# Patient Record
Sex: Female | Born: 1980 | Race: Black or African American | Hispanic: No | Marital: Single | Smoking: Current every day smoker
Health system: Southern US, Community
[De-identification: ages and names within clinical notes are randomized; demographics above are authoritative.]

---

## 2002-01-20 ENCOUNTER — Other Ambulatory Visit: Admission: RE | Admit: 2002-01-20 | Discharge: 2002-01-20 | Payer: Self-pay | Admitting: Family Medicine

## 2002-03-09 ENCOUNTER — Other Ambulatory Visit: Admission: RE | Admit: 2002-03-09 | Discharge: 2002-03-09 | Payer: Self-pay | Admitting: Obstetrics and Gynecology

## 2002-03-09 ENCOUNTER — Encounter (INDEPENDENT_AMBULATORY_CARE_PROVIDER_SITE_OTHER): Payer: Self-pay | Admitting: *Deleted

## 2002-03-09 ENCOUNTER — Encounter: Admission: RE | Admit: 2002-03-09 | Discharge: 2002-03-09 | Payer: Self-pay | Admitting: *Deleted

## 2002-06-10 ENCOUNTER — Encounter (INDEPENDENT_AMBULATORY_CARE_PROVIDER_SITE_OTHER): Payer: Self-pay | Admitting: Specialist

## 2002-06-10 ENCOUNTER — Encounter: Admission: RE | Admit: 2002-06-10 | Discharge: 2002-06-10 | Payer: Self-pay | Admitting: Obstetrics and Gynecology

## 2002-08-23 ENCOUNTER — Emergency Department (HOSPITAL_COMMUNITY): Admission: EM | Admit: 2002-08-23 | Discharge: 2002-08-23 | Payer: Self-pay | Admitting: Emergency Medicine

## 2003-05-12 ENCOUNTER — Other Ambulatory Visit: Admission: RE | Admit: 2003-05-12 | Discharge: 2003-05-12 | Payer: Self-pay | Admitting: Family Medicine

## 2003-06-10 ENCOUNTER — Emergency Department (HOSPITAL_COMMUNITY): Admission: AD | Admit: 2003-06-10 | Discharge: 2003-06-10 | Payer: Self-pay | Admitting: Family Medicine

## 2003-08-23 ENCOUNTER — Encounter: Admission: RE | Admit: 2003-08-23 | Discharge: 2003-08-23 | Payer: Self-pay | Admitting: Sports Medicine

## 2003-09-21 ENCOUNTER — Encounter: Admission: RE | Admit: 2003-09-21 | Discharge: 2003-09-21 | Payer: Self-pay | Admitting: Family Medicine

## 2003-10-21 ENCOUNTER — Encounter: Admission: RE | Admit: 2003-10-21 | Discharge: 2003-10-21 | Payer: Self-pay | Admitting: Family Medicine

## 2003-11-30 ENCOUNTER — Encounter (INDEPENDENT_AMBULATORY_CARE_PROVIDER_SITE_OTHER): Payer: Self-pay | Admitting: *Deleted

## 2003-11-30 LAB — CONVERTED CEMR LAB

## 2003-12-26 ENCOUNTER — Encounter (INDEPENDENT_AMBULATORY_CARE_PROVIDER_SITE_OTHER): Payer: Self-pay | Admitting: Specialist

## 2003-12-26 ENCOUNTER — Other Ambulatory Visit: Admission: RE | Admit: 2003-12-26 | Discharge: 2003-12-26 | Payer: Self-pay | Admitting: Family Medicine

## 2003-12-26 ENCOUNTER — Encounter: Admission: RE | Admit: 2003-12-26 | Discharge: 2003-12-26 | Payer: Self-pay | Admitting: Family Medicine

## 2004-01-18 ENCOUNTER — Encounter: Admission: RE | Admit: 2004-01-18 | Discharge: 2004-01-18 | Payer: Self-pay | Admitting: Family Medicine

## 2004-01-31 ENCOUNTER — Encounter: Admission: RE | Admit: 2004-01-31 | Discharge: 2004-01-31 | Payer: Self-pay | Admitting: Family Medicine

## 2004-02-21 ENCOUNTER — Encounter: Admission: RE | Admit: 2004-02-21 | Discharge: 2004-02-21 | Payer: Self-pay | Admitting: Family Medicine

## 2004-04-05 ENCOUNTER — Ambulatory Visit: Payer: Self-pay | Admitting: Family Medicine

## 2004-07-12 ENCOUNTER — Encounter (INDEPENDENT_AMBULATORY_CARE_PROVIDER_SITE_OTHER): Payer: Self-pay | Admitting: *Deleted

## 2004-07-12 ENCOUNTER — Ambulatory Visit: Payer: Self-pay | Admitting: Family Medicine

## 2004-12-18 ENCOUNTER — Ambulatory Visit: Payer: Self-pay | Admitting: Obstetrics and Gynecology

## 2004-12-18 ENCOUNTER — Encounter (INDEPENDENT_AMBULATORY_CARE_PROVIDER_SITE_OTHER): Payer: Self-pay | Admitting: *Deleted

## 2006-06-06 ENCOUNTER — Ambulatory Visit: Payer: Self-pay | Admitting: Family Medicine

## 2006-06-06 ENCOUNTER — Encounter (INDEPENDENT_AMBULATORY_CARE_PROVIDER_SITE_OTHER): Payer: Self-pay | Admitting: *Deleted

## 2006-06-06 ENCOUNTER — Other Ambulatory Visit: Admission: RE | Admit: 2006-06-06 | Discharge: 2006-06-06 | Payer: Self-pay | Admitting: Family Medicine

## 2006-06-16 ENCOUNTER — Ambulatory Visit: Payer: Self-pay | Admitting: Family Medicine

## 2006-08-28 DIAGNOSIS — J45909 Unspecified asthma, uncomplicated: Secondary | ICD-10-CM | POA: Insufficient documentation

## 2006-08-28 DIAGNOSIS — E669 Obesity, unspecified: Secondary | ICD-10-CM | POA: Insufficient documentation

## 2006-08-28 DIAGNOSIS — R8789 Other abnormal findings in specimens from female genital organs: Secondary | ICD-10-CM

## 2006-08-28 DIAGNOSIS — N879 Dysplasia of cervix uteri, unspecified: Secondary | ICD-10-CM | POA: Insufficient documentation

## 2006-08-28 DIAGNOSIS — F17200 Nicotine dependence, unspecified, uncomplicated: Secondary | ICD-10-CM

## 2006-08-29 ENCOUNTER — Encounter (INDEPENDENT_AMBULATORY_CARE_PROVIDER_SITE_OTHER): Payer: Self-pay | Admitting: *Deleted

## 2006-10-30 ENCOUNTER — Ambulatory Visit: Payer: Self-pay | Admitting: Obstetrics & Gynecology

## 2006-10-30 ENCOUNTER — Encounter (INDEPENDENT_AMBULATORY_CARE_PROVIDER_SITE_OTHER): Payer: Self-pay | Admitting: Obstetrics & Gynecology

## 2007-03-25 ENCOUNTER — Ambulatory Visit: Payer: Self-pay | Admitting: Obstetrics and Gynecology

## 2007-03-27 ENCOUNTER — Ambulatory Visit (HOSPITAL_COMMUNITY): Admission: RE | Admit: 2007-03-27 | Discharge: 2007-03-27 | Payer: Self-pay | Admitting: Obstetrics and Gynecology

## 2007-04-01 ENCOUNTER — Ambulatory Visit: Payer: Self-pay | Admitting: Obstetrics & Gynecology

## 2007-04-16 ENCOUNTER — Ambulatory Visit: Payer: Self-pay | Admitting: Obstetrics and Gynecology

## 2007-12-22 ENCOUNTER — Telehealth (INDEPENDENT_AMBULATORY_CARE_PROVIDER_SITE_OTHER): Payer: Self-pay | Admitting: *Deleted

## 2007-12-22 ENCOUNTER — Ambulatory Visit: Payer: Self-pay | Admitting: Family Medicine

## 2007-12-22 DIAGNOSIS — N63 Unspecified lump in unspecified breast: Secondary | ICD-10-CM

## 2007-12-22 DIAGNOSIS — N6019 Diffuse cystic mastopathy of unspecified breast: Secondary | ICD-10-CM

## 2007-12-25 ENCOUNTER — Telehealth: Payer: Self-pay | Admitting: Family Medicine

## 2007-12-28 ENCOUNTER — Encounter: Payer: Self-pay | Admitting: Family Medicine

## 2007-12-28 ENCOUNTER — Telehealth: Payer: Self-pay | Admitting: *Deleted

## 2007-12-28 ENCOUNTER — Telehealth: Payer: Self-pay | Admitting: Family Medicine

## 2007-12-29 IMAGING — US US TRANSVAGINAL NON-OB
1 series · 14 of 25 positions shown · non-contrast
Comparison: none

CLINICAL DATA: Irregular menstrual cycles.
 TRANSABDOMINAL AND TRANSVAGINAL PELVIC ULTRASOUND:
TECHNIQUE: Both transabdominal and transvaginal ultrasound examinations of the pelvis were performed including evaluation of the uterus, ovaries, adnexal regions, and pelvic cul-de-sac.

[Series 1: us transvaginal non-ob · 0.28mm/px · 14 of 47 slices shown]
[im 1/47]
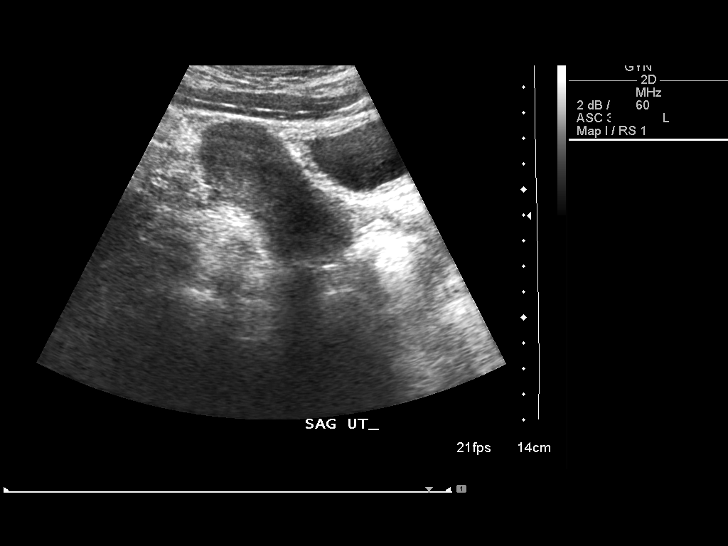
[im 4/47]
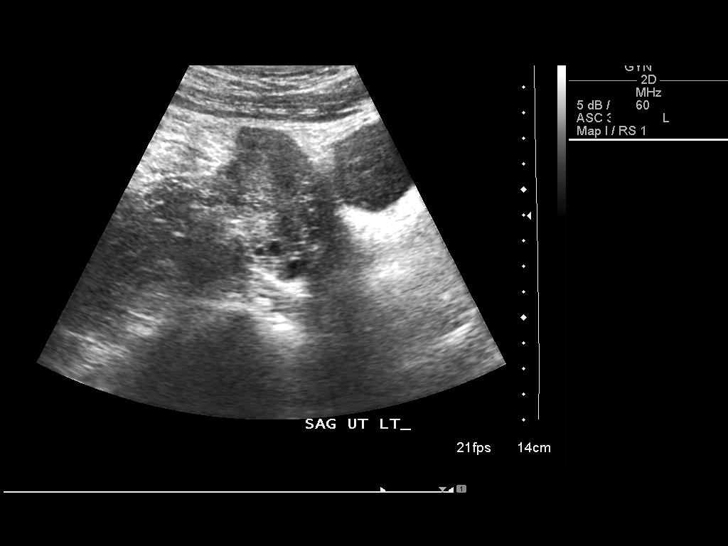
[im 8/47]
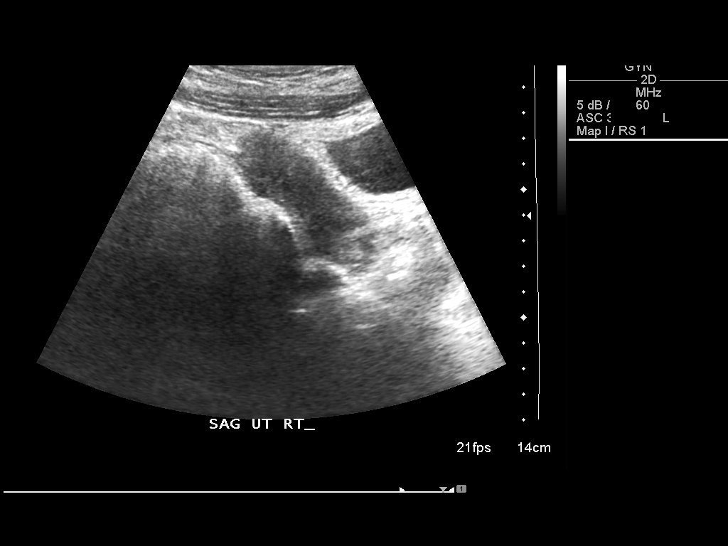
[im 12/47]
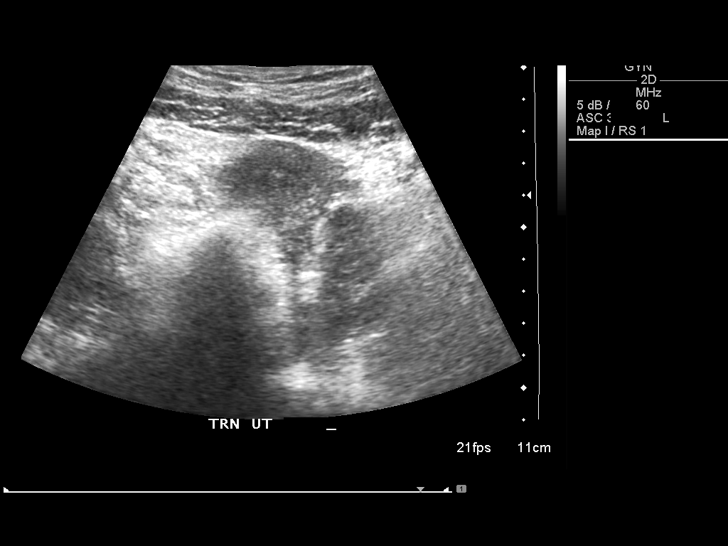
[im 16/47]
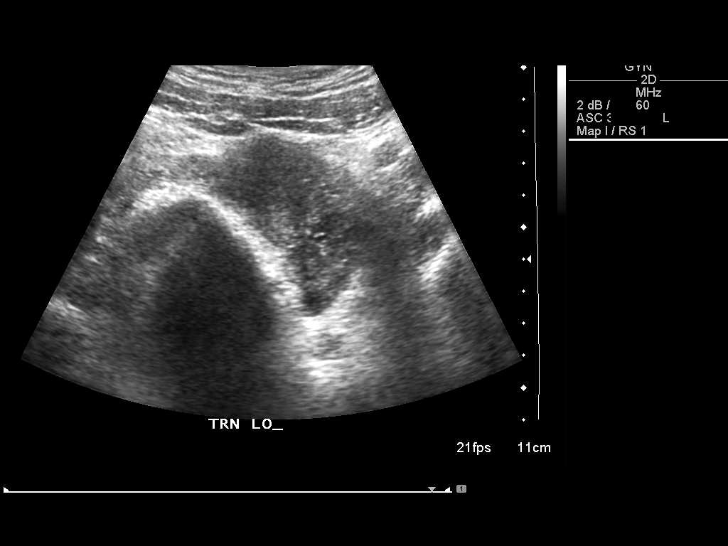
[im 18/47]
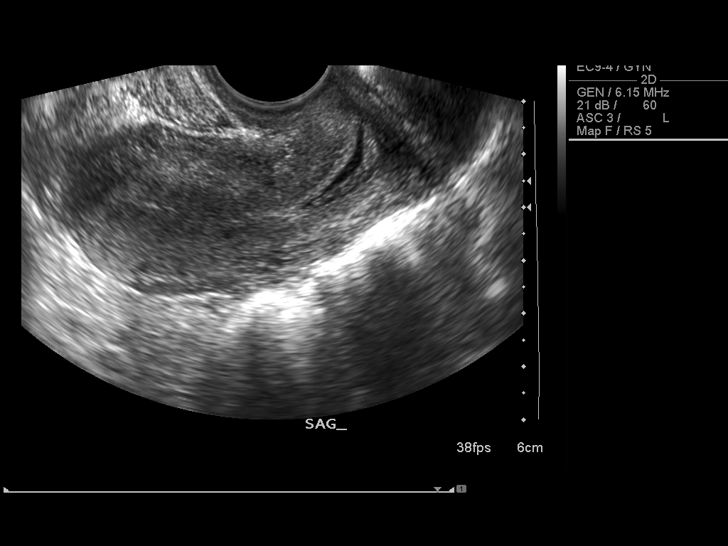
[im 22/47]
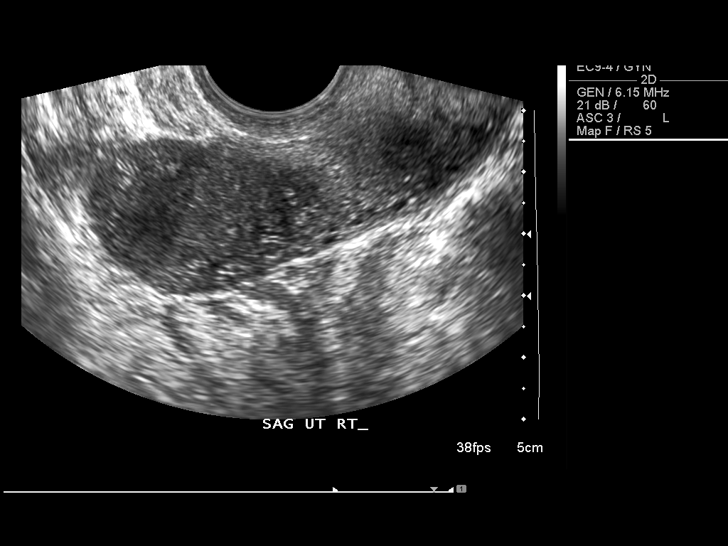
[im 25/47]
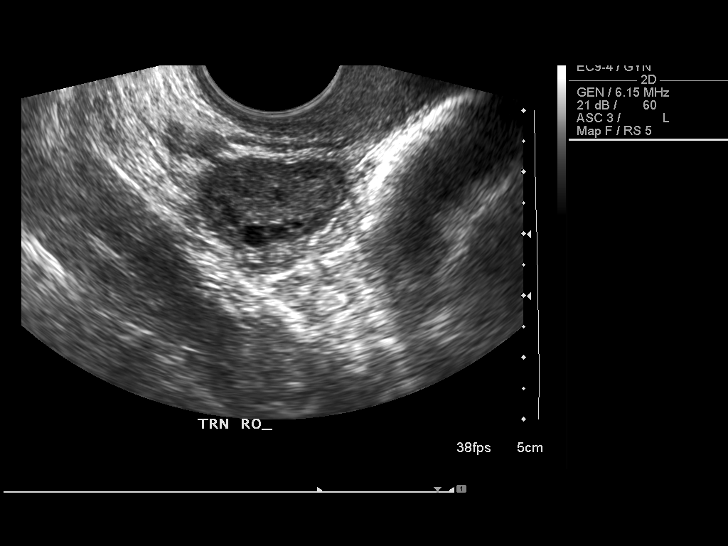
[im 29/47]
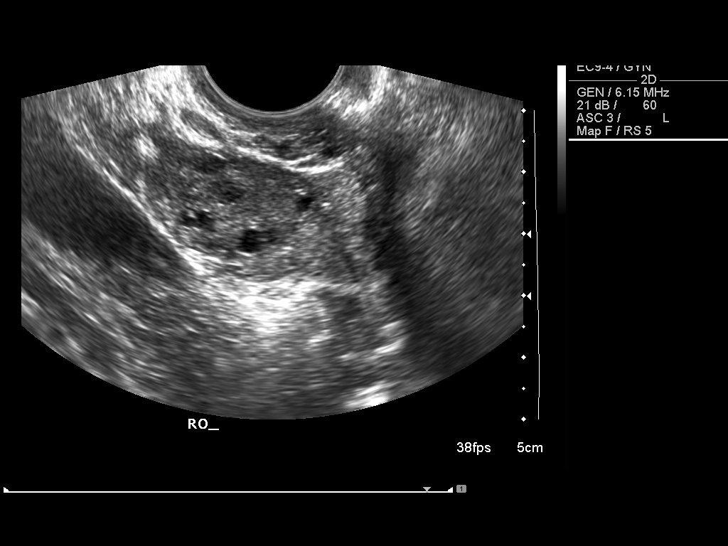
[im 31/47]
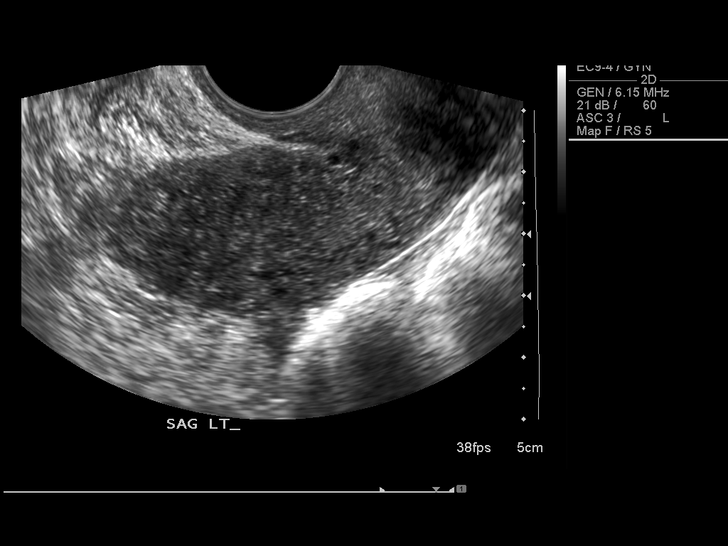
[im 35/47]
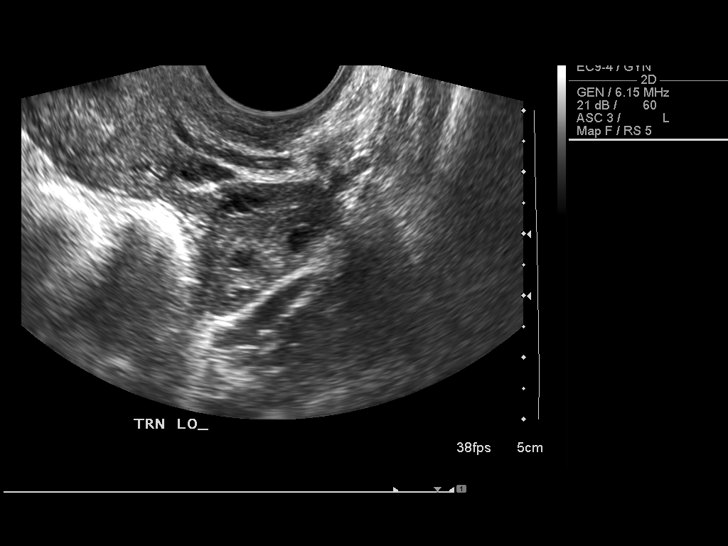
[im 39/47]
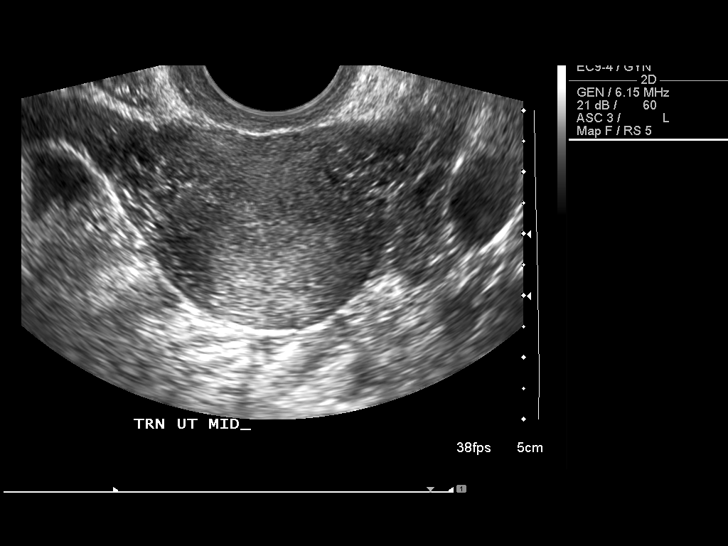
[im 43/47]
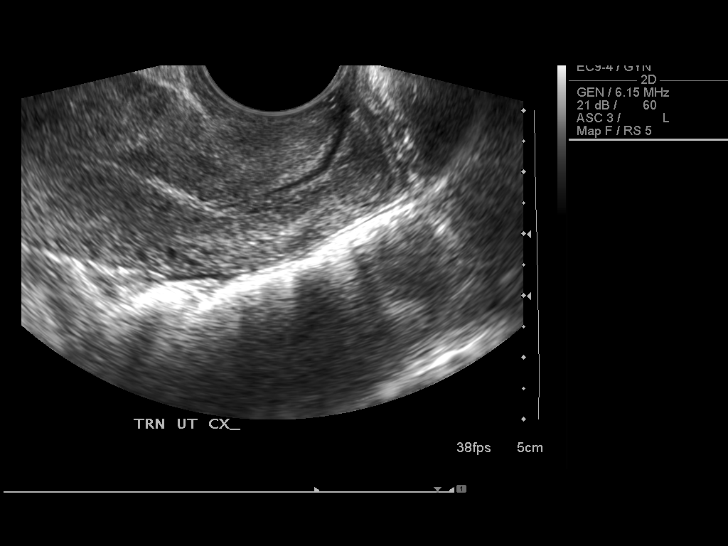
[im 47/47]
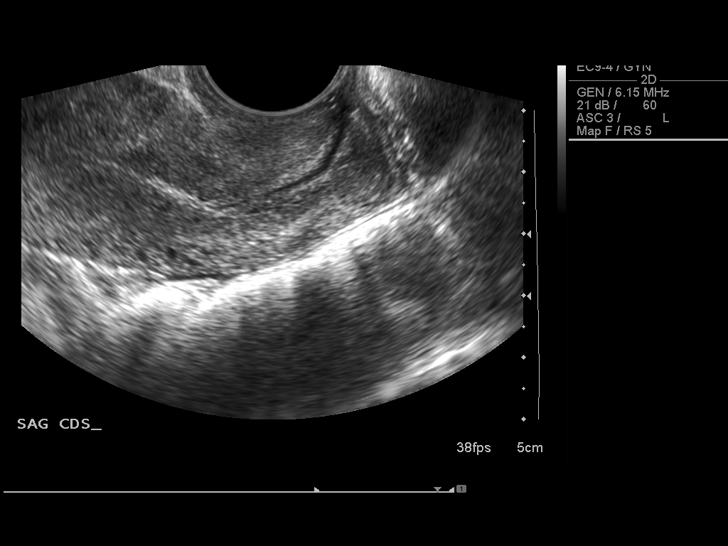

[14 of 25 positions shown; findings below may reference images not displayed]

FINDINGS: The uterus has a normal size, shape, and echotexture.  The uterine dimensions are 7.3 x 3.3 x 4.3 cm.  The endometrium is thin and homogeneous, measuring 4 mm in width.  Both ovaries have a normal size and appearance.  The right ovary measures 3.1 x 1.5 x 2.2 cm, and the left ovary measures 3.5 x 1.9 x 2.0 cm.  No free pelvic fluid is seen.
IMPRESSION: Normal pelvic ultrasound.

## 2008-01-13 ENCOUNTER — Encounter: Payer: Self-pay | Admitting: Family Medicine

## 2008-01-13 ENCOUNTER — Other Ambulatory Visit: Admission: RE | Admit: 2008-01-13 | Discharge: 2008-01-13 | Payer: Self-pay | Admitting: Family Medicine

## 2008-01-13 ENCOUNTER — Ambulatory Visit: Payer: Self-pay | Admitting: Family Medicine

## 2008-01-13 DIAGNOSIS — E663 Overweight: Secondary | ICD-10-CM | POA: Insufficient documentation

## 2008-03-16 ENCOUNTER — Ambulatory Visit: Payer: Self-pay | Admitting: Family Medicine

## 2008-04-07 ENCOUNTER — Ambulatory Visit: Payer: Self-pay | Admitting: Family Medicine

## 2008-04-08 ENCOUNTER — Telehealth: Payer: Self-pay | Admitting: Family Medicine

## 2008-06-29 ENCOUNTER — Ambulatory Visit: Payer: Self-pay | Admitting: Family Medicine

## 2008-10-04 ENCOUNTER — Telehealth: Payer: Self-pay | Admitting: Internal Medicine

## 2008-12-10 ENCOUNTER — Emergency Department (HOSPITAL_COMMUNITY): Admission: EM | Admit: 2008-12-10 | Discharge: 2008-12-10 | Payer: Self-pay | Admitting: Emergency Medicine

## 2009-02-02 ENCOUNTER — Telehealth: Payer: Self-pay | Admitting: Family Medicine

## 2009-02-03 ENCOUNTER — Encounter: Payer: Self-pay | Admitting: Family Medicine

## 2009-02-03 ENCOUNTER — Other Ambulatory Visit: Admission: RE | Admit: 2009-02-03 | Discharge: 2009-02-03 | Payer: Self-pay | Admitting: Family Medicine

## 2009-02-03 ENCOUNTER — Ambulatory Visit: Payer: Self-pay | Admitting: Family Medicine

## 2009-02-03 DIAGNOSIS — N912 Amenorrhea, unspecified: Secondary | ICD-10-CM

## 2009-09-13 IMAGING — CR DG CHEST 2V
2 series · 2 of 2 positions shown · non-contrast
Comparison: None

CLINICAL DATA: Short of breath

CHEST - 2 VIEW

[w chest pa]
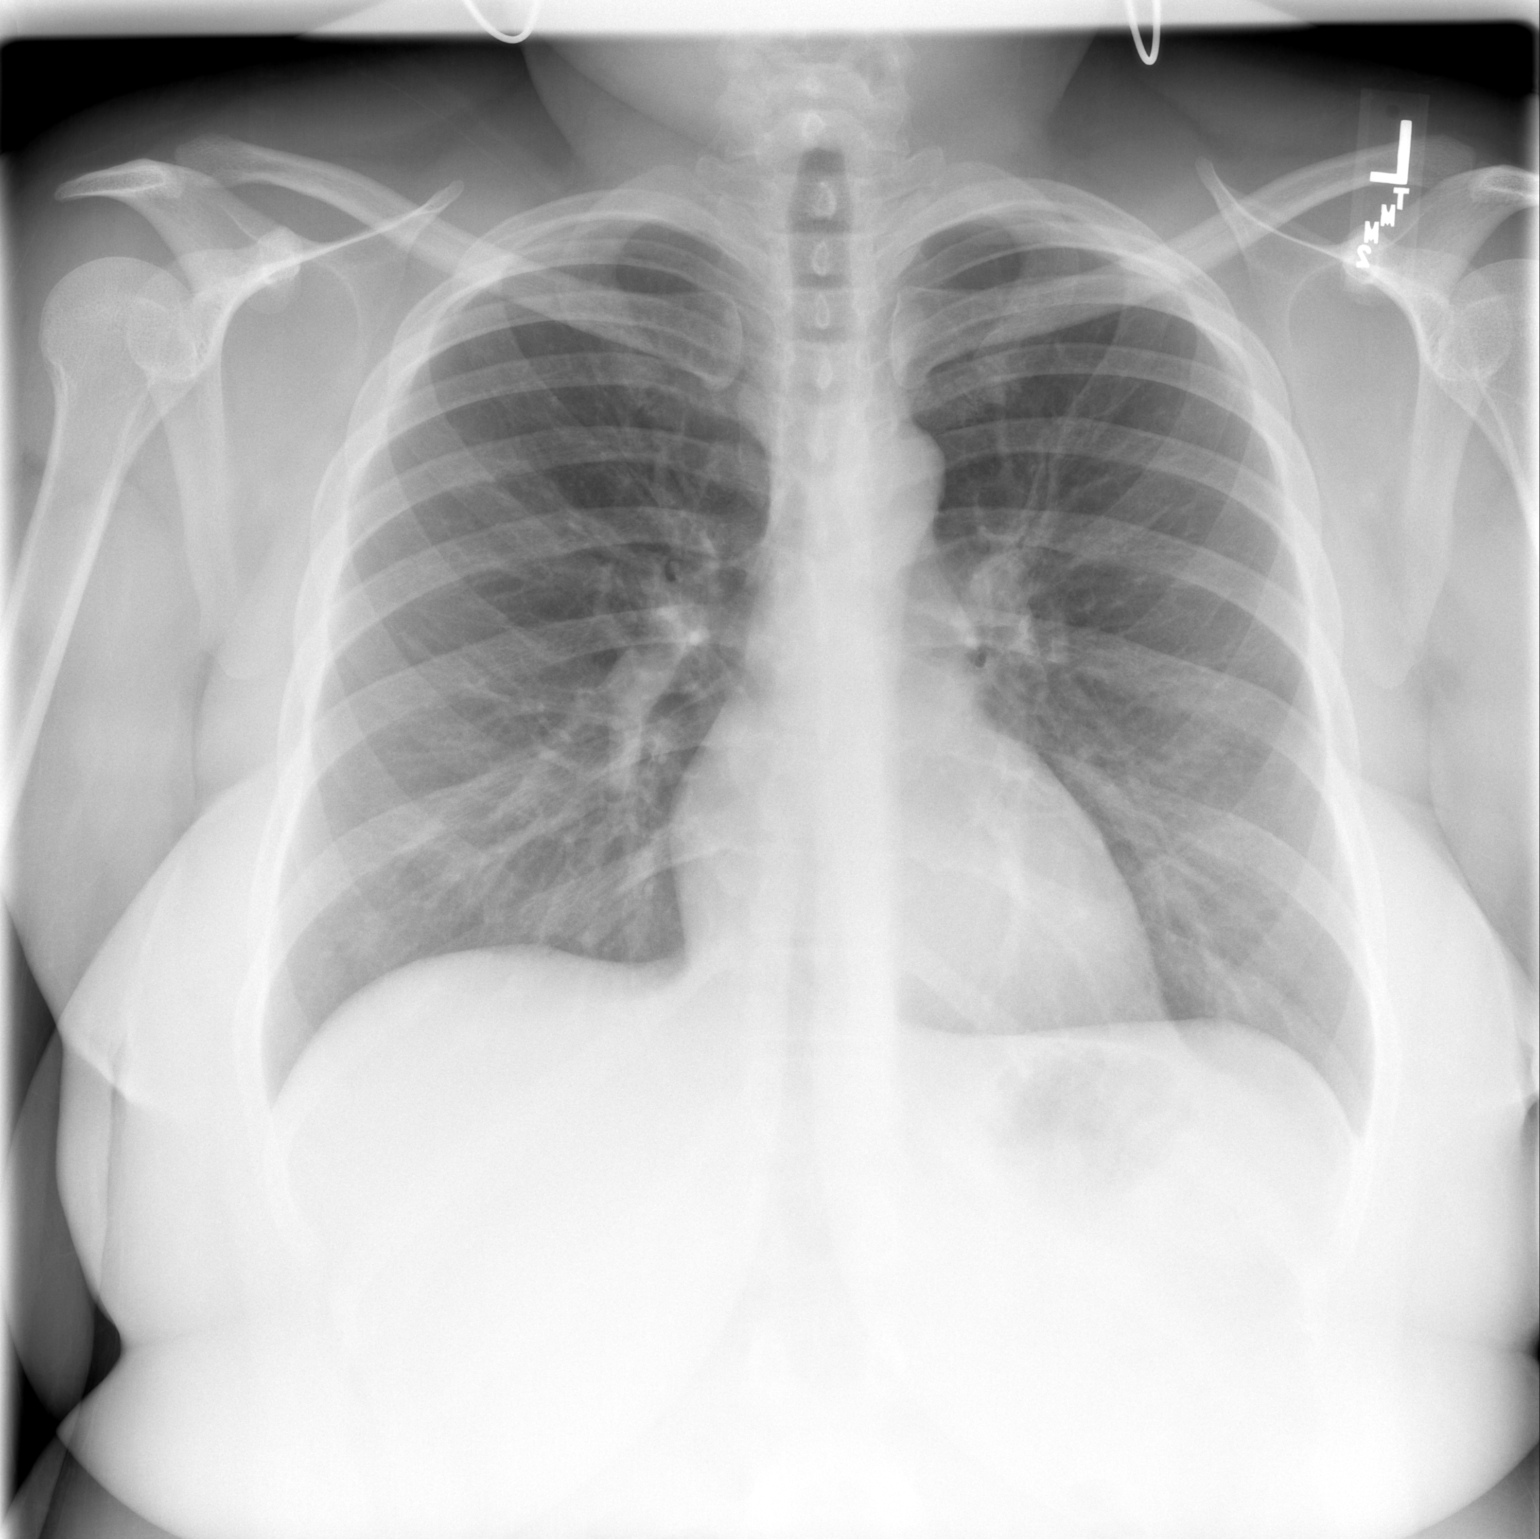

[w chest lat]
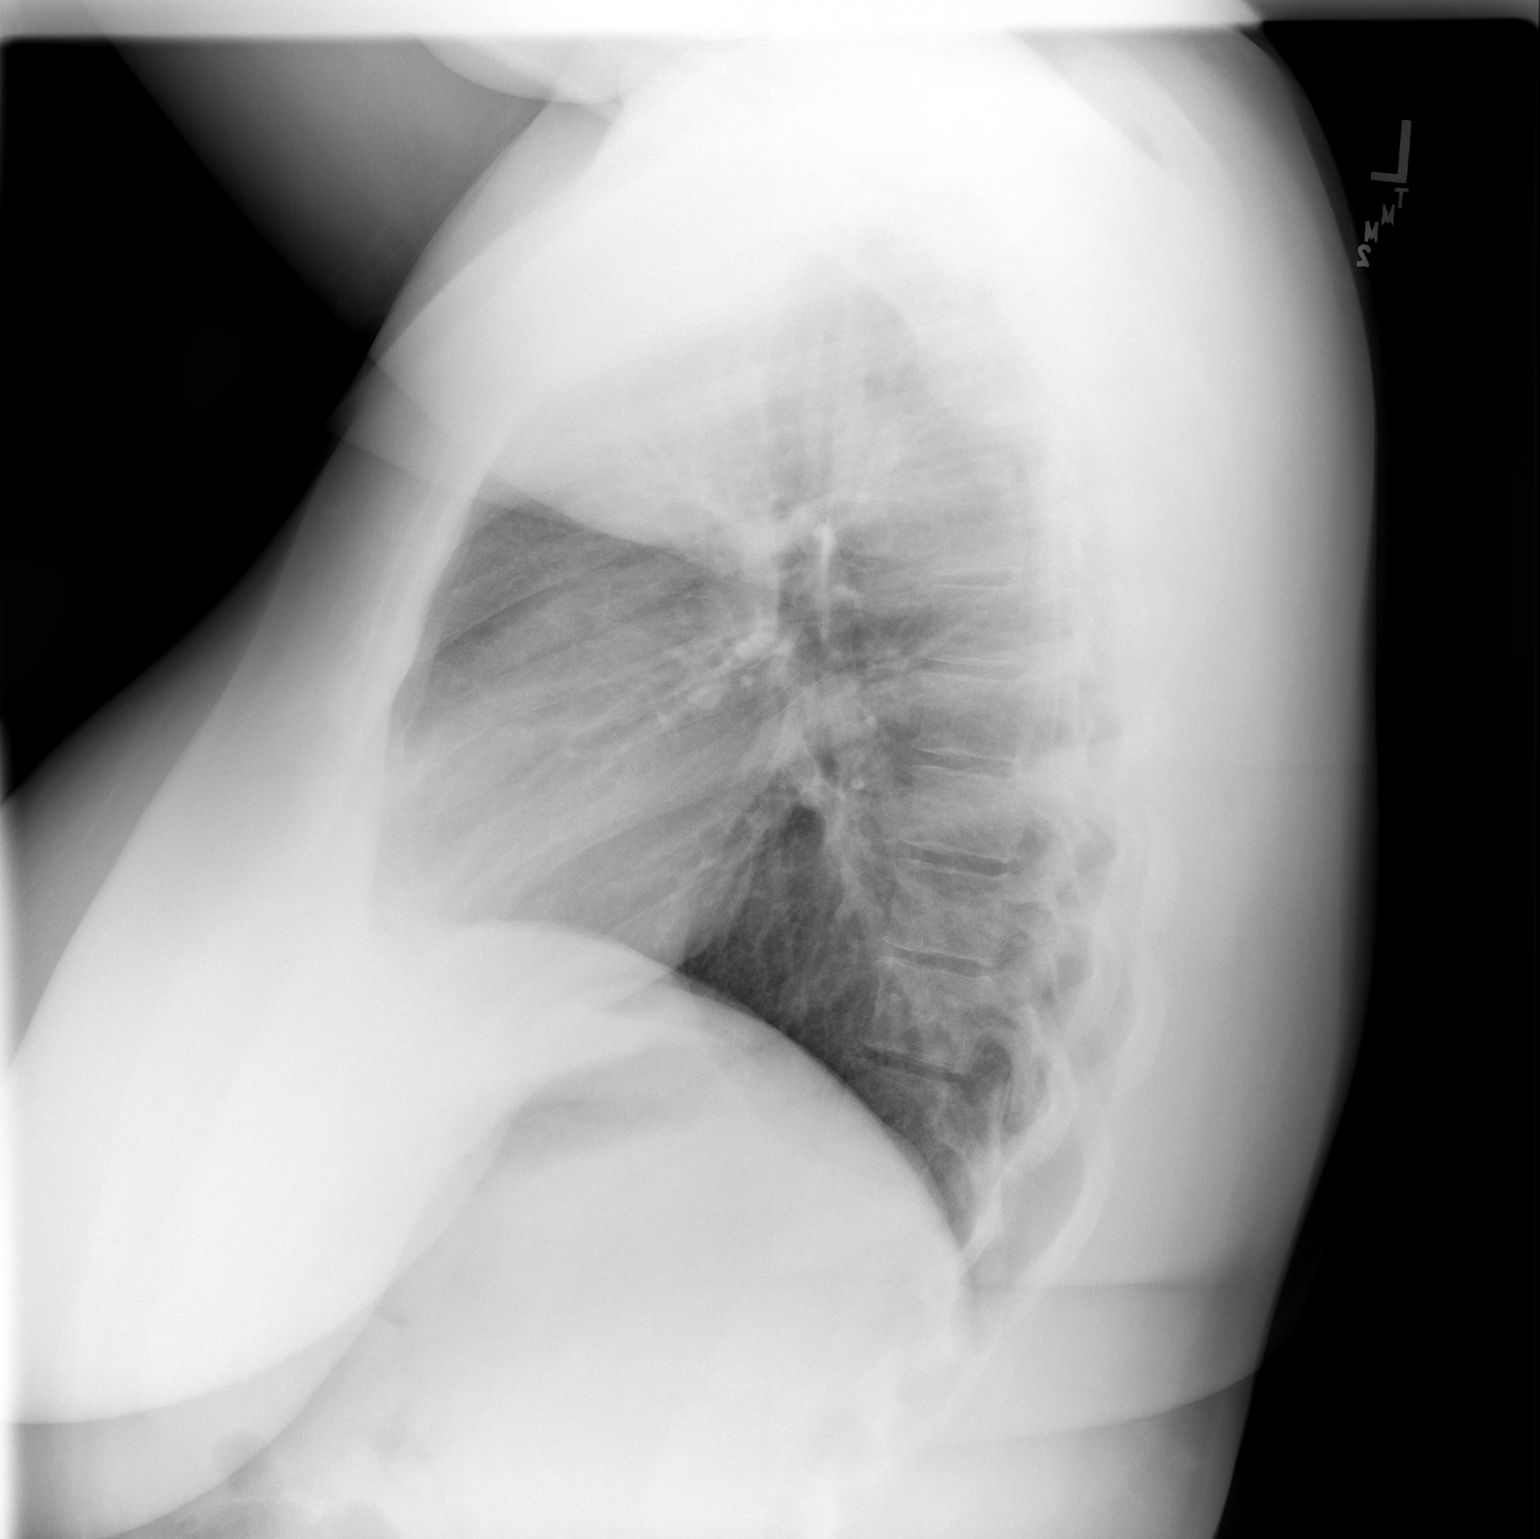

[2 of 2 positions shown; findings below may reference images not displayed]

FINDINGS: The heart size and mediastinal contours are within
normal limits.  Both lungs are clear.  The visualized skeletal
structures are unremarkable.
IMPRESSION: No active cardiopulmonary disease.

## 2009-12-27 ENCOUNTER — Ambulatory Visit: Payer: Self-pay | Admitting: Internal Medicine

## 2009-12-27 DIAGNOSIS — N979 Female infertility, unspecified: Secondary | ICD-10-CM | POA: Insufficient documentation

## 2009-12-27 LAB — CONVERTED CEMR LAB: Beta hcg, urine, semiquantitative: NEGATIVE

## 2010-07-31 NOTE — Assessment & Plan Note (Signed)
Summary: ?pregnant/njr   Vital Signs:  Patient profile:   30 year old female Weight:      260 pounds Temp:     98.5 degrees F oral BP sitting:   130 / 82  (left arm) Cuff size:   regular  Vitals Entered By: Duard Brady LPN (December 27, 2009 11:29 AM) CC: thinks she maybe preg. -  last period Jan. - no birth control - Is Patient Diabetic? No   CC:  thinks she maybe preg. -  last period Jan. - no birth control -.  History of Present Illness: 30 year old patient who has a long history of oligomenorrhea and formerly had been on Depo-Provera until December of 2010.  She presents today requesting a pregnancy test.  She states  she has attempted to become pregnant for some time.  She also has a history of an abnormal Pap in the past, but Specimen 10 months ago, was normal.  She is requesting referral to gynecology due to her infertility and oligomenorrhea  Preventive Screening-Counseling & Management  Alcohol-Tobacco     Smoking Status: current  Allergies: 1)  ! Penicillin  Social History: Smoking Status:  current  Physical Exam  General:  overweight-appearing. normal inblood pressure in no distress   Impression & Recommendations:  Problem # 1:  FEMALE INFERTILITY (ICD-628.9)  Orders: Gynecologic Referral (Gyn)  Complete Medication List: 1)  Proventil Hfa 108 (90 Base) Mcg/act Aers (Albuterol sulfate) .... 2 inhalations every 6 hours as needed wheezing  Other Orders: Urine Pregnancy Test  (66440)  Patient Instructions: 1)  gynecologic referral as scheduled  Laboratory Results   Urine Tests  Date/Time Received: December 27, 2009 11:42 AM  Date/Time Reported: December 27, 2009 11:43 AM     Urine HCG: negative

## 2010-11-13 NOTE — Group Therapy Note (Signed)
NAME:  Angela Peters, Angela Peters NO.:  [id number]   MEDICAL RECORD NO.:  [id number]          PATIENT TYPE:  WOC   LOCATION:  [facility]                   FACILITY:  [facility]   PHYSICIAN:  Argentina Donovan, MD        DATE OF BIRTH:  May 28, 1981   DATE OF SERVICE:                                  CLINIC NOTE   CHIEF COMPLAINT:  Amenorrhea.   HISTORY OF PRESENT ILLNESS:  The patient is a 30 year old who was last  seen on May 2008 for amenorrhea.  She is here today with the same  complaints.  At that time she was given 10 days of Provera to take and  did have a menstrual cycle.  She bled for about a week.  She was also  given oral contraceptive pills however she has not been taking these  pills because she feels like she is a drug addict when she does.  She  would also like to discuss birth control.  She has taken Depo in the  past but she has taken this for about 2 years when she was in high  school and started gaining weight on this.  She is considering an IUD.  All of the other options did not work for her.  The patient is also  complaining of bilateral nipple discharge that has been going on for at  least a few months.  The discharge is white and in both nipples and it  occurs while pressing on the nipple to squeeze it as well as when she is  not squeezing her nipples.  The patient had hormone levels checked in  the past for testosterone which was very high at 264.7.  Her free  testosterone was 47.1 which was also high.  Her TSH was normal.  This  was back from January of 2006.  It seems that she has been having  irregular periods for a while now but amenorrhea for 4 months now.   PHYSICAL EXAMINATION:  VITAL SIGNS:  Temperature 99.3, pulse 87, blood  pressure 121/82, weight 238 pounds.  General:  The patient is alert, in  no acute distress.  Abdomen:  Soft, nondistended, nontender, no  hepatosplenomegaly.  Pelvic exam:  Not performed today as it was  performed at her last visit  which was May of this year.  Breast exam:  Today showed bilateral nipple discharge that was white in color.  This  could be expressed by squeezing the nipples.  Breast exam showed no  supraclavicular lymphadenopathy, no nodules or lumps.  Normal breast  tissue, no erythema.   ASSESSMENT/PLAN:  This is a 30 year old, G0, P0, here with bilateral  nipple discharge, amenorrhea, and high testosterone levels back from 2  years ago.  Her bilateral nipple discharge and her high testosterone  levels are concerning and differential diagnoses include PCOS,  congenital adrenal hyperplasia, Cushing's disease.  We will have her  testosterone, free and total, checked again as well as her prolactin  level given her nipple discharge.  We will also send her for a pelvic  ultrasound to make sure that she is not having any cysts  that would be  supportive of PCOS.  At her next visit, may consider evaluating her for  hirsutism, did not see any evidence of this today since I did not do a  pelvic exam.  Could look for hair distribution on the stomach as well as  look for an enlarged clitoris.  I did not note any increase in facial  hair.  The patient on return will need to discuss birth control options.  She was advised to use condoms or the birth control pills she was given  previously until her next appointment.           ______________________________  Argentina Donovan, MD    PR/MEDQ  D:  03/25/2007  T:  03/26/2007  Job:  [id number]

## 2010-11-13 NOTE — Group Therapy Note (Signed)
NAME:  Angela Peters, Angela Peters NO.:  [id number]   MEDICAL RECORD NO.:  [id number]          PATIENT TYPE:  WOC   LOCATION:  [facility]                   FACILITY:  [facility]   PHYSICIAN:  Elsie Lincoln, MD      DATE OF BIRTH:  September 03, 1980   DATE OF SERVICE:  04/01/2007                                  CLINIC NOTE   The patient is a 30 year old female who presents for ultrasound results  and lab results.  The patient has been waiting 2 hours and is very  irritated and does not really want to hear about her ultrasound results  or her lab values. I tried to explain in some detail about what is going  with her; however, she was just more interested in leaving.  Her  progesterone is slightly elevated, her prolactin is normal. Her TSH is  normal.  Her ultrasound basically shows normal.  There was no evidence  of ring of pearls on her ovaries.  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  She is not interested in getting pregnant and  so we will start her on Depo.  She did refuse birth control pills.  She  is having unprotected sex so she has to have a pregnancy test done today  and have protected sex for 2 weeks and then a repeat pregnancy test in 2  weeks and then we will give her the Depo.  She is again quite annoyed  because she does not believe there is any chance she could be pregnant.  I tried to explain to her that this is entirely possible that she could  be pregnant, but she is still again quite irritated and left abruptly  after giving a urine sample.  We will do a urine pregnancy test today.  At her next visit, she will see the RN only and she will do another  urine pregnancy test, wait for the result and get her Depo-Provera. At  that point, she will be on q.12 weeks Depo-Provera and will only need to  see [place] for health maintenance.  She is due for a Pap smear in May.     ______________________________  Elsie Lincoln, MD     KL/MEDQ  D:  04/01/2007  T:  04/02/2007  Job:  [id number]

## 2010-11-16 NOTE — Group Therapy Note (Signed)
NAME:  Angela Peters, Angela Peters NO.:  [id number]   MEDICAL RECORD NO.:  [id number]          PATIENT TYPE:  WOC   LOCATION[facility]                   FACILITY:  [facility]   PHYSICIAN:  Dorthula Perfect, MD     DATE OF BIRTH:  04-Aug-1980   DATE OF SERVICE:                                  CLINIC NOTE   REASON FOR OFFICE VISIT:  Pap smear, annual breast exam.   Angela Peters is a 30 year old prima gravida, last menstrual period in  March 2008, who presents today for routine Pap smear as well as a breast  exam.  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  The patient is status post cryo in her last Pap smear in  December 2007, showed no evidence of intraepithelial lesions, occasional  benign endometrial fields present.  The patient has been worked up in  the past for amenorrhea and was worked up for polycystic ovarian  syndrome.  Please note in the patient's chart that she had an elevated  testosterone level, normal DHEA.  The patient states she has had  irregular cycles for 2 years lasting approximately 3 days and then she  usually skips one to two months between cycles.  She denies any  hirsutism symptoms today.  States that she is sexually active and the  last encounter was unprotected at least one week ago.  Of note patient  does have a family history of breast cancer in 2 aunts at age 74.  She  cannot recall if it was maternal or paternal.  She does have sisters as  well but does not keep in contact with them.  The patient is not on any  contraception and has used Yasmin in the past but stopped secondary to  nausea.   PHYSICAL EXAMINATION:  VITAL SIGNS:  Reviewed.  GU:  Patient has normal external female genitalia, Bartholin glands are  normal.  Vaginal vault without discharge.  Cervix is normal.  Uterus is  retroflexed.  Normal size.  No masses or cyst felt.  BREAST EXAM:  Reveals no masses or lesions.  No nipple discharge.  No  axillary  adenopathy noted.   LABORATORIES:  Lab work from today:  TSH is pending.  Routine Pap smear  was performed.   IMPRESSION:  1. Polycystic ovarian syndrome.  2. Irregular menses.   PLAN:  If urine pregnancy today is negative we will start the patient on  Provera 10 mg to take for a total of 10 days.  We have explained how to  take this and will have withdrawal bleeding.  She is to start __________  on the Sunday  after withdrawal bleeding and to continue this.  Patient given a  prescription for 1 year supply and 2 months samples.  She will return  for follow up in 3 months. Patient was seen with Dr. Perlie Gold.     ______________________________  DICTATOR Angela Peters    ______________________________  Dorthula Perfect, MD    DD/MEDQ  D:  10/30/2006  T:  10/30/2006  Job:  [id number]

## 2010-11-16 NOTE — Group Therapy Note (Signed)
NAME:  Angela Peters, Angela Peters NO.:  [id number]   MEDICAL RECORD NO.:  [id number]          PATIENT TYPE:  WOC   [facility]ATION[facility]                   FACILITY:  [facility]   PHYSICIAN:  Tinnie Gens, MD        DATE OF BIRTH:  February 09, 1981   DATE OF SERVICE:  07/12/2004                                    CLINIC NOTE   CHIEF COMPLAINT:  Follow-up cryo.   SUBJECTIVE:  Angela Peters is here today for a repeat Pap smear after  cryotherapy in October.  She had a history of multiple low-grade SILs and  then a colposcopy that showed CIN 2 with a benign ECC.  She underwent cryo  in October and has had no complications regarding that.   She is having a problem with amenorrhea.  She has not had a menses since  November 17.  She states that it is a common occurrence for her over the  last 2 years.  She has noticed increased weight gain.  Denies any voice  changes, no hair changes.  She has not had her sugars checked.  She has not  noticed any acanthosis.   FAMILY HISTORY:  She states her grandmother is a diet-controlled diabetic  but does not know the family history of her siblings or parents.   OBJECTIVE:  VITAL SIGNS:  Noted.  GENERAL:  She is an obese African-American female in no acute distress.  No  increased facial hair noted and no acanthosis noted.  PELVIC:  Reveals normal external female genitalia, normal urethra and  vaginal wall.  The cervix appears nulliparous without any lesions or masses.   ASSESSMENT AND PLAN:  1.  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      I went ahead and repeated her Pap smear today.  2.  Amenorrhea.  Her urine pregnancy test was negative.  I have checked labs      to evaluate her for polycystic ovarian syndrome.  I have given her 2      months worth of the NuvaRing and explained how to use it to see if I can      get her to have a withdrawal  menses.  3.  She is to return in 4 weeks for follow-up.      LC/MEDQ  D:  07/12/2004  T:  07/12/2004  Job:  [id number]

## 2010-11-16 NOTE — Group Therapy Note (Signed)
NAME:  Angela Peters, Angela Peters NO.:  [id number]   MEDICAL RECORD NO.:  [id number]          PATIENT TYPE:  WOC   [facility]ATION[facility]                   [facility]ILITY:  [facility]   PHYSICIAN:  Tinnie Gens, MD        DATE OF BIRTH:  1980/11/08   DATE OF SERVICE:  04/05/2004                                    CLINIC NOTE   CHIEF COMPLAINT:  Abnormal Pap.   HISTORY OF PRESENT ILLNESS:  The patient is a 30 year old gravida 0 who is  referred from [facility] for abnormal Pap smear.  She has had  several low-grade SILs and then a repeat colposcopy revealed CIN-2 in one of  the lesions with a benign ECC.  Given the fact this patient has not even  begun her childbearing, the patient was counseled about cryotherapy.  The  patient wishes to undergo this procedure today.   PROCEDURE:  The patient was placed in dorsal lithotomy.  A speculum was  placed inside the vagina.  The cervix was visualized.  Cryo was performed  and freeze attained x3 minutes, thaw x5 minutes, and repeat freeze x3  minutes.  The patient tolerated the procedure well.  She was given Motrin  800 mg prior to the procedure.   IMPRESSION:  Abnormal Pap status post treatment with cryotherapy.   PLAN:  The patient was told to expect a watery, foul-smelling discharge for  the next 2 weeks.  She is to have no intercourse for the next 2 week.  Follow-up will be with Pap smears every 4 months for the next year, followed  by Paps every 6 months for a year, followed by return to yearly Paps.  The  patient understands all this.  She will follow up in 4 months for Pap smear  - either here or with [facility].      TP/MEDQ  D:  04/05/2004  T:  04/05/2004  Job:  [id number]   cc:   Ursula Beath, MD

## 2010-11-26 ENCOUNTER — Emergency Department (HOSPITAL_COMMUNITY)
Admission: EM | Admit: 2010-11-26 | Discharge: 2010-11-26 | Disposition: A | Discharge status: Home / Self Care | Payer: Self-pay | Attending: Emergency Medicine | Admitting: Emergency Medicine

## 2010-11-26 DIAGNOSIS — H538 Other visual disturbances: Secondary | ICD-10-CM | POA: Insufficient documentation

## 2010-11-26 DIAGNOSIS — H109 Unspecified conjunctivitis: Secondary | ICD-10-CM | POA: Insufficient documentation

## 2010-11-26 DIAGNOSIS — H5789 Other specified disorders of eye and adnexa: Secondary | ICD-10-CM | POA: Insufficient documentation

## 2011-04-11 LAB — POCT PREGNANCY, URINE
Operator id: 194561
Preg Test, Ur: NEGATIVE
Preg Test, Ur: NEGATIVE

## 2011-05-25 ENCOUNTER — Encounter: Payer: Self-pay | Admitting: Emergency Medicine

## 2011-05-25 ENCOUNTER — Emergency Department (HOSPITAL_COMMUNITY)
Admission: EM | Admit: 2011-05-25 | Discharge: 2011-05-25 | Disposition: A | Discharge status: Home / Self Care | Payer: Self-pay | Attending: Emergency Medicine | Admitting: Emergency Medicine

## 2011-05-25 DIAGNOSIS — M25579 Pain in unspecified ankle and joints of unspecified foot: Secondary | ICD-10-CM | POA: Insufficient documentation

## 2011-05-25 DIAGNOSIS — S93409A Sprain of unspecified ligament of unspecified ankle, initial encounter: Secondary | ICD-10-CM | POA: Insufficient documentation

## 2011-05-25 DIAGNOSIS — X500XXA Overexertion from strenuous movement or load, initial encounter: Secondary | ICD-10-CM | POA: Insufficient documentation

## 2011-05-25 MED ORDER — IBUPROFEN 800 MG PO TABS: 800.0000 mg | ORAL_TABLET | Freq: Three times a day (TID) | ORAL | Status: AC

## 2011-05-25 MED ORDER — IBUPROFEN 800 MG PO TABS
800.0000 mg | ORAL_TABLET | Freq: Once | ORAL | Status: AC
  Administered 2011-05-25: 800 mg via ORAL
  Filled 2011-05-25: qty 1

## 2011-05-25 NOTE — ED Notes (Signed)
Pt states she fell on 11/10, continues to have pain to R ankle radiating to R foot.

## 2011-05-25 NOTE — ED Notes (Signed)
Patient is alert and oriented x3.  She has been given DC instructions with follow care instructions She gave verbal understanding.  V/S stable.  She is not showing any signs of distress on DC

## 2011-05-25 NOTE — ED Provider Notes (Signed)
Medical screening examination/treatment/procedure(s) were performed by non-physician practitioner and as supervising physician I was immediately available for consultation/collaboration.  Olivia Mackie, MD 05/25/11 [phone]
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
## 2012-08-03 ENCOUNTER — Other Ambulatory Visit (HOSPITAL_COMMUNITY)
Admission: RE | Admit: 2012-08-03 | Discharge: 2012-08-03 | Disposition: A | Discharge status: Home / Self Care | Payer: 59 | Source: Ambulatory Visit | Attending: Emergency Medicine | Admitting: Emergency Medicine

## 2012-08-03 ENCOUNTER — Encounter (HOSPITAL_COMMUNITY): Payer: Self-pay | Admitting: Emergency Medicine

## 2012-08-03 ENCOUNTER — Emergency Department (INDEPENDENT_AMBULATORY_CARE_PROVIDER_SITE_OTHER)
Admission: EM | Admit: 2012-08-03 | Discharge: 2012-08-03 | Disposition: A | Discharge status: Home / Self Care | Payer: 59 | Source: Home / Self Care | Attending: Emergency Medicine | Admitting: Emergency Medicine

## 2012-08-03 DIAGNOSIS — N898 Other specified noninflammatory disorders of vagina: Secondary | ICD-10-CM

## 2012-08-03 DIAGNOSIS — N760 Acute vaginitis: Secondary | ICD-10-CM | POA: Insufficient documentation

## 2012-08-03 DIAGNOSIS — A749 Chlamydial infection, unspecified: Secondary | ICD-10-CM

## 2012-08-03 DIAGNOSIS — Z113 Encounter for screening for infections with a predominantly sexual mode of transmission: Secondary | ICD-10-CM | POA: Insufficient documentation

## 2012-08-03 DIAGNOSIS — N939 Abnormal uterine and vaginal bleeding, unspecified: Secondary | ICD-10-CM

## 2012-08-03 DIAGNOSIS — A5400 Gonococcal infection of lower genitourinary tract, unspecified: Secondary | ICD-10-CM

## 2012-08-03 LAB — POCT URINALYSIS DIP (DEVICE)
Leukocytes, UA: NEGATIVE
Nitrite: NEGATIVE
Protein, ur: 100 mg/dL — AB
Urobilinogen, UA: 2 mg/dL — ABNORMAL HIGH (ref 0.0–1.0)
pH: 7 (ref 5.0–8.0)

## 2012-08-03 MED ORDER — NORETHINDRONE-ETH ESTRADIOL 1-35 MG-MCG PO TABS: ORAL_TABLET | ORAL | Status: DC

## 2012-08-03 NOTE — ED Provider Notes (Signed)
History     CSN: [id number]  Arrival date & time 08/03/12  1035   First MD Initiated Contact with Patient 08/03/12 1402      Chief Complaint  Patient presents with  . Vaginal Bleeding    (Consider location/radiation/quality/duration/timing/severity/associated sxs/prior treatment) HPI Comments: Pt usually has light, 3 day long periods each month.  Missed a period in December, January's period was 2 weeks ago but only lasted for a day.  Today woke up with heavy vaginal bleeding, heavier than patient's normal periods, associated with cramping that feels like menstrual cramping. No hx STDs, pt's current partner is female, pt is not pregnant.   Patient is a 32 y.o. female presenting with vaginal bleeding. The history is provided by the patient.  Vaginal Bleeding This is a new problem. The current episode started 6 to 12 hours ago. The problem occurs constantly. The problem has not changed since onset.Associated symptoms include abdominal pain. Nothing aggravates the symptoms. Nothing relieves the symptoms. She has tried nothing for the symptoms.    Past Medical History  Diagnosis Date  . Asthma     History reviewed. No pertinent past surgical history.  No family history on file.  History  Substance Use Topics  . Smoking status: Current Every Day Smoker  . Smokeless tobacco: Not on file  . Alcohol Use: Yes    OB History    Grav Para Term Preterm Abortions TAB SAB Ect Mult Living                  Review of Systems  Constitutional: Negative for fever and chills.  Gastrointestinal: Positive for abdominal pain and constipation. Negative for nausea and vomiting.       Rarely has bowel movements  Genitourinary: Positive for vaginal bleeding and menstrual problem. Negative for dysuria, flank pain, vaginal discharge and vaginal pain.  Hematological: Does not bruise/bleed easily.    Allergies  Penicillins  Home Medications   Current Outpatient Rx  Name  Route  Sig  Dispense   Refill  . NORETHINDRONE-ETH ESTRADIOL 1-35 MG-MCG PO TABS      Take 1 tab po BID for 5 days, then 1 tab po qd until all active tabs taken   1 Package   0     BP 144/97  Pulse 77  Temp 98.6 F (37 C) (Oral)  Resp 16  SpO2 99%  LMP 07/24/2012  Physical Exam  Constitutional: She appears well-developed and well-nourished. No distress.       obese  Cardiovascular: Normal rate and regular rhythm.   Pulmonary/Chest: Effort normal and breath sounds normal.  Abdominal: Soft. Bowel sounds are normal. She exhibits no distension. There is no tenderness. There is no rebound and no guarding.  Genitourinary: There is no rash, tenderness, lesion or injury on the right labia. There is no rash, tenderness, lesion or injury on the left labia. Uterus is tender. Cervix exhibits no motion tenderness and no discharge. Right adnexum displays no mass, no tenderness and no fullness. Left adnexum displays tenderness. Left adnexum displays no mass and no fullness. There is bleeding around the vagina. No erythema or tenderness around the vagina. No signs of injury around the vagina. No vaginal discharge found.       Vaginal bleeding source is from cervical os.     ED Course  Procedures (including critical care time)  Labs Reviewed  POCT URINALYSIS DIP (DEVICE) - Abnormal; Notable for the following:    Bilirubin Urine SMALL (*)  Ketones, ur TRACE (*)     Hgb urine dipstick LARGE (*)     Protein, ur 100 (*)     Urobilinogen, UA 2.0 (*)     All other components within normal limits  POCT PREGNANCY, URINE  CERVICOVAGINAL ANCILLARY ONLY   No results found.   1. Abnormal vaginal bleeding       MDM  Heavy menses vs other source of bleeding.  Pt is hirsute, obese- bleeding could be result of endocrine disorder. Pt has annual appt with pcp on 2/25.  Will address abnormal bleeding with him at that time.  Pt given rx for ocp to manage dysfunctional uterine bleeding should it continue to be heavy and  last longer than 7 days.         Cathlyn Parsons, NP 08/03/12 1409

## 2012-08-03 NOTE — ED Provider Notes (Signed)
Medical screening examination/treatment/procedure(s) were performed by non-physician practitioner and as supervising physician I was immediately available for consultation/collaboration.  Lavaun Greenfield, M.D.   Shon Mansouri C Samyia Motter, MD 08/03/12 2226 

## 2012-08-03 NOTE — ED Notes (Addendum)
Pt c/o vag bleeding since this am  Reports getting of LMP 10 days ago Sx include: abd and lower back pain; first time this happens Denies: vag discharge, dysuria, f/v/n/d Has not had meds today Has appt w/pcp on 08/25/12  She is alert w/no signs of acute distress.

## 2012-08-06 ENCOUNTER — Telehealth (HOSPITAL_COMMUNITY): Payer: Self-pay | Admitting: Emergency Medicine

## 2012-08-06 MED ORDER — AZITHROMYCIN 500 MG PO TABS: 1000.0000 mg | ORAL_TABLET | Freq: Every day | ORAL | Status: DC

## 2012-08-06 MED ORDER — METRONIDAZOLE 500 MG PO TABS: 500.0000 mg | ORAL_TABLET | Freq: Two times a day (BID) | ORAL | Status: DC

## 2012-08-06 NOTE — Telephone Encounter (Signed)
Message copied by Reuben Likes on Thu Aug 06, 2012 10:01 AM ------      Message from: Vassie Moselle      Created: Wed Aug 05, 2012  5:37 PM       GC/Chlamydia and Gardnerella pos.  Need orders.  This is Angela's pt. But I think you were the attending.      Vassie Moselle      08/05/2012

## 2012-08-06 NOTE — ED Notes (Signed)
The patient's DNA probe came back positive for gonorrhea, Chlamydia, and Gardnerella. We'll have her come in for Rocephin 250 mg IM and prescriptions will be sent to her pharmacy for a Zithromax in 1000 mg by mouth, one time only and Flagyl 500 mg twice a day for a week to cover the Gardnerella and Chlamydia.  Reuben Likes, MD 08/06/12 1002

## 2012-08-07 ENCOUNTER — Telehealth (HOSPITAL_COMMUNITY): Payer: Self-pay | Admitting: *Deleted

## 2012-08-07 NOTE — ED Notes (Signed)
GC/chlamydia both pos., Affirm: gardnerella pos., Candida and trich neg.,  Orders obtained from Dr. Lorenz Coaster for Zithromax and Flagyl.  Pt. needs to be notified to come back for Rocephin 250 mg. IM. I called pt. and left a message to call. Vassie Moselle 08/07/2012

## 2012-08-07 NOTE — ED Notes (Signed)
Pt. called back.  Pt. verified x 2 and given results.  Pt. told to pick up Rx. of Zithromax and Flagyl at the [facility] on [address].    Pt. instructed to no alcohol while taking this medication.   Pt. said she will come in tomorrow for her Rocephin injection.  Pt. instructed to notify her partner, no sex for 1 week and to practice safe sex. Pt. told they can get HIV testing at the [facility]. STD clinic, by appointment.

## 2012-08-08 ENCOUNTER — Encounter (HOSPITAL_COMMUNITY): Payer: Self-pay | Admitting: Emergency Medicine

## 2012-08-08 ENCOUNTER — Emergency Department (INDEPENDENT_AMBULATORY_CARE_PROVIDER_SITE_OTHER)
Admission: EM | Admit: 2012-08-08 | Discharge: 2012-08-08 | Disposition: A | Discharge status: Home / Self Care | Payer: 59 | Source: Home / Self Care | Attending: Family Medicine | Admitting: Family Medicine

## 2012-08-08 DIAGNOSIS — A5400 Gonococcal infection of lower genitourinary tract, unspecified: Secondary | ICD-10-CM

## 2012-08-08 DIAGNOSIS — A749 Chlamydial infection, unspecified: Secondary | ICD-10-CM

## 2012-08-08 MED ORDER — CEFTRIAXONE SODIUM 250 MG IJ SOLR
INTRAMUSCULAR | Status: AC
  Filled 2012-08-08: qty 250

## 2012-08-08 MED ORDER — CEFTRIAXONE SODIUM 250 MG IJ SOLR
250.0000 mg | Freq: Once | INTRAMUSCULAR | Status: AC
  Administered 2012-08-08: 250 mg via INTRAMUSCULAR

## 2012-08-08 MED ORDER — LIDOCAINE HCL (PF) 1 % IJ SOLN
INTRAMUSCULAR | Status: AC
  Filled 2012-08-08: qty 5

## 2012-08-08 NOTE — ED Notes (Signed)
Pt is here for Rocephin 250mg  inj per Dr. Lorenz Coaster.

## 2012-08-12 NOTE — ED Notes (Signed)
[employer] forms x 2 completed and faxed to the [facility]. Vassie Moselle 08/12/2012

## 2012-08-18 ENCOUNTER — Other Ambulatory Visit: Payer: Self-pay

## 2012-08-25 ENCOUNTER — Encounter: Payer: Self-pay | Admitting: Family Medicine

## 2012-08-25 ENCOUNTER — Ambulatory Visit (INDEPENDENT_AMBULATORY_CARE_PROVIDER_SITE_OTHER): Payer: 59 | Admitting: Family Medicine

## 2012-08-25 VITALS — BP 120/84 | Temp 99.0°F | Ht 67.0 in | Wt 232.0 lb

## 2012-08-25 DIAGNOSIS — E669 Obesity, unspecified: Secondary | ICD-10-CM

## 2012-08-25 NOTE — Progress Notes (Signed)
  Subjective:    Patient ID: Angela Peters, female    DOB: 05/18/1981, 32 y.o.   MRN: [id number]  HPI Angela Peters is a 32 year old single female who comes in today for a physical examination  She went to an urgent care within the past month and had a complete physical exam except for breast exam for dysfunctional uterine bleeding. Pap smear was normal. She's not using birth control she would like to get pregnant. She has a boyfriend but not married and they're not using birth control I tried to convince her that birth control would be the best option to prevent getting pregnant but she declines   Review of Systems Review of systems otherwise negative    Objective:   Physical Exam Well developed is overweight female no acute distress only breast exam today which was normal since she had a complete physical examination already       Assessment & Plan:  Overweight continue diet exercise and weight loss return in one year for physical exam sooner if any problem

## 2013-07-12 ENCOUNTER — Encounter (HOSPITAL_COMMUNITY): Payer: Self-pay | Admitting: Emergency Medicine

## 2013-07-12 ENCOUNTER — Emergency Department (HOSPITAL_COMMUNITY)
Admission: EM | Admit: 2013-07-12 | Discharge: 2013-07-12 | Disposition: A | Discharge status: Home / Self Care | Payer: 59 | Attending: Emergency Medicine | Admitting: Emergency Medicine

## 2013-07-12 DIAGNOSIS — R197 Diarrhea, unspecified: Secondary | ICD-10-CM | POA: Insufficient documentation

## 2013-07-12 DIAGNOSIS — F17200 Nicotine dependence, unspecified, uncomplicated: Secondary | ICD-10-CM | POA: Insufficient documentation

## 2013-07-12 DIAGNOSIS — R51 Headache: Secondary | ICD-10-CM | POA: Insufficient documentation

## 2013-07-12 DIAGNOSIS — Z880 Allergy status to penicillin: Secondary | ICD-10-CM | POA: Insufficient documentation

## 2013-07-12 DIAGNOSIS — J45909 Unspecified asthma, uncomplicated: Secondary | ICD-10-CM | POA: Insufficient documentation

## 2013-07-12 DIAGNOSIS — Z3202 Encounter for pregnancy test, result negative: Secondary | ICD-10-CM | POA: Insufficient documentation

## 2013-07-12 DIAGNOSIS — IMO0002 Reserved for concepts with insufficient information to code with codable children: Secondary | ICD-10-CM | POA: Insufficient documentation

## 2013-07-12 DIAGNOSIS — R6889 Other general symptoms and signs: Secondary | ICD-10-CM

## 2013-07-12 DIAGNOSIS — E669 Obesity, unspecified: Secondary | ICD-10-CM | POA: Insufficient documentation

## 2013-07-12 LAB — CBC WITH DIFFERENTIAL/PLATELET
Basophils Absolute: 0 10*3/uL (ref 0.0–0.1)
Basophils Relative: 1 % (ref 0–1)
Eosinophils Absolute: 0.2 10*3/uL (ref 0.0–0.7)
Eosinophils Relative: 3 % (ref 0–5)
HCT: 42.3 % (ref 36.0–46.0)
Hemoglobin: 14.3 g/dL (ref 12.0–15.0)
Lymphocytes Relative: 24 % (ref 12–46)
Lymphs Abs: 1.5 10*3/uL (ref 0.7–4.0)
MCH: 28.9 pg (ref 26.0–34.0)
MCHC: 33.8 g/dL (ref 30.0–36.0)
MCV: 85.5 fL (ref 78.0–100.0)
Monocytes Absolute: 0.8 10*3/uL (ref 0.1–1.0)
Monocytes Relative: 12 % (ref 3–12)
Neutro Abs: 3.9 10*3/uL (ref 1.7–7.7)
Neutrophils Relative %: 61 % (ref 43–77)
Platelets: 379 10*3/uL (ref 150–400)
RBC: 4.95 MIL/uL (ref 3.87–5.11)
RDW: 13.5 % (ref 11.5–15.5)
WBC: 6.4 10*3/uL (ref 4.0–10.5)

## 2013-07-12 LAB — URINE MICROSCOPIC-ADD ON

## 2013-07-12 LAB — COMPREHENSIVE METABOLIC PANEL
ALT: 14 U/L (ref 0–35)
AST: 17 U/L (ref 0–37)
Albumin: 3.7 g/dL (ref 3.5–5.2)
Alkaline Phosphatase: 79 U/L (ref 39–117)
BUN: 7 mg/dL (ref 6–23)
CO2: 25 mEq/L (ref 19–32)
Calcium: 8.8 mg/dL (ref 8.4–10.5)
Chloride: 104 mEq/L (ref 96–112)
Creatinine, Ser: 0.8 mg/dL (ref 0.50–1.10)
GFR calc Af Amer: >90 mL/min (ref >90)
GFR calc non Af Amer: >90 mL/min (ref >90)
Glucose, Bld: 104 mg/dL — ABNORMAL HIGH (ref 70–99)
Potassium: 3.6 mEq/L — ABNORMAL LOW (ref 3.7–5.3)
Sodium: 143 mEq/L (ref 137–147)
Total Bilirubin: 0.2 mg/dL — ABNORMAL LOW (ref 0.3–1.2)
Total Protein: 7.6 g/dL (ref 6.0–8.3)

## 2013-07-12 LAB — URINALYSIS, ROUTINE W REFLEX MICROSCOPIC
Bilirubin Urine: NEGATIVE
Glucose, UA: NEGATIVE mg/dL
Hgb urine dipstick: NEGATIVE
Ketones, ur: NEGATIVE mg/dL
Leukocytes, UA: NEGATIVE
Nitrite: NEGATIVE
Protein, ur: 30 mg/dL — AB
Specific Gravity, Urine: 1.036 — ABNORMAL HIGH (ref 1.005–1.030)
Urobilinogen, UA: 1 mg/dL (ref 0.0–1.0)
pH: 6 (ref 5.0–8.0)

## 2013-07-12 LAB — PREGNANCY, URINE: Preg Test, Ur: NEGATIVE

## 2013-07-12 LAB — POCT PREGNANCY, URINE: Preg Test, Ur: NEGATIVE

## 2013-07-12 NOTE — ED Notes (Signed)
Per Laban Emperorarrell, EMT, urine already sent.   Changed POCT to regular pregnancy.  He will send forms and call lab back.

## 2013-07-12 NOTE — ED Notes (Signed)
Pt states intermittent sickness over last month, reports diarrhea, headache, cough, eyes watering.

## 2013-07-12 NOTE — ED Provider Notes (Signed)
CSN: [id number]     Arrival date & time 07/12/13  1109 History   First MD Initiated Contact with Patient 07/12/13 1502     Chief Complaint  Patient presents with  . Cough  . Headache  . Diarrhea   (Consider location/radiation/quality/duration/timing/severity/associated sxs/prior Treatment) HPI  32yF presenting feeling "sick." Pt initially seen by mid level and then by myself after she refused to give mid level a history. Unfortunately I found it very difficult to establish any rapport or otherwise have a therapeutic relationship. Pt frustrated with having to give history to several people. "I done told four other people why I'm here. Don't you talk to each other? Did you look in my chart?" "I'm sick. I'm sick. I don't know what else you want me to say." I acknowledge her frustration. Explained that I had reviewed nursing notes, but that I would like to hear it her own words. Despite several lines of questioning I was unable to get past that pt is "sick" and her symptoms began "a month" ago. Pt began taking herself off monitoring equipment while I was speaking with her and stated, "I'm done. I don't know why I wasted my time." she then walked out.   Past Medical History  Diagnosis Date  . Asthma    History reviewed. No pertinent past surgical history. No family history on file. History  Substance Use Topics  . Smoking status: Current Every Day Smoker  . Smokeless tobacco: Not on file  . Alcohol Use: Yes   OB History   Grav Para Term Preterm Abortions TAB SAB Ect Mult Living                 Review of Systems  Allergies  Penicillins  Home Medications  No current outpatient prescriptions on file. BP 125/85  Pulse 82  Temp(Src) 98.3 F (36.8 C) (Oral)  Resp 18  SpO2 100% Physical Exam  Nursing note and vitals reviewed. Constitutional: She appears well-developed and well-nourished. No distress.  Sitting in bed. NAD. Obese.   HENT:  Head: Normocephalic and atraumatic.   Eyes: Conjunctivae are normal. Right eye exhibits no discharge. Left eye exhibits no discharge.  Pulmonary/Chest: Effort normal.  Speaking in complete sentences. No increased WOB.   Musculoskeletal:  No obvious external signs of trauma  Neurological: She is alert.  Gait steady  Psychiatric: Thought content normal.  Mildly agitated    ED Course  Procedures (including critical care time) Labs Review Labs Reviewed  COMPREHENSIVE METABOLIC PANEL - Abnormal; Notable for the following:    Potassium 3.6 (*)    Glucose, Bld 104 (*)    Total Bilirubin 0.2 (*)    All other components within normal limits  URINALYSIS, ROUTINE W REFLEX MICROSCOPIC - Abnormal; Notable for the following:    APPearance HAZY (*)    Specific Gravity, Urine 1.036 (*)    Protein, ur 30 (*)    All other components within normal limits  URINE MICROSCOPIC-ADD ON - Abnormal; Notable for the following:    Squamous Epithelial / LPF MANY (*)    Bacteria, UA MANY (*)    All other components within normal limits  URINE CULTURE  CBC WITH DIFFERENTIAL  PREGNANCY, URINE  POCT PREGNANCY, URINE   Imaging Review No results found.  EKG Interpretation   None       MDM   1. Feels sick    32yF with feeling sick. Pt walked out in the middle of trying to speak with her. NAD.  Normal vitals. No increased WOB. Labs reviewed. No particularly concerning findings. Bacteria on UA but many squam cells. Unknown if having urinary symptoms.     Raeford RazorStephen Tanay Massiah, MD 07/12/13 [phone]

## 2013-07-13 LAB — URINE CULTURE

## 2019-10-09 ENCOUNTER — Other Ambulatory Visit: Payer: Self-pay

## 2019-10-09 ENCOUNTER — Emergency Department (HOSPITAL_COMMUNITY)
Admission: EM | Admit: 2019-10-09 | Discharge: 2019-10-09 | Disposition: A | Discharge status: Home / Self Care | Payer: Self-pay | Attending: Emergency Medicine | Admitting: Emergency Medicine

## 2019-10-09 DIAGNOSIS — W269XXA Contact with unspecified sharp object(s), initial encounter: Secondary | ICD-10-CM | POA: Insufficient documentation

## 2019-10-09 DIAGNOSIS — Y999 Unspecified external cause status: Secondary | ICD-10-CM | POA: Insufficient documentation

## 2019-10-09 DIAGNOSIS — Y9389 Activity, other specified: Secondary | ICD-10-CM | POA: Insufficient documentation

## 2019-10-09 DIAGNOSIS — Y92019 Unspecified place in single-family (private) house as the place of occurrence of the external cause: Secondary | ICD-10-CM | POA: Insufficient documentation

## 2019-10-09 DIAGNOSIS — J45909 Unspecified asthma, uncomplicated: Secondary | ICD-10-CM | POA: Insufficient documentation

## 2019-10-09 DIAGNOSIS — S61411A Laceration without foreign body of right hand, initial encounter: Secondary | ICD-10-CM | POA: Insufficient documentation

## 2019-10-09 DIAGNOSIS — F17200 Nicotine dependence, unspecified, uncomplicated: Secondary | ICD-10-CM | POA: Insufficient documentation

## 2019-10-09 MED ORDER — NAPROXEN 500 MG PO TABS
500.0000 mg | ORAL_TABLET | Freq: Once | ORAL | Status: AC
  Administered 2019-10-09: 23:00:00 500 mg via ORAL
  Filled 2019-10-09: qty 1

## 2019-10-09 MED ORDER — LIDOCAINE-EPINEPHRINE (PF) 2 %-1:200000 IJ SOLN
10.0000 mL | Freq: Once | INTRAMUSCULAR | Status: AC
  Administered 2019-10-09: 10 mL
  Filled 2019-10-09: qty 20

## 2019-10-09 MED ORDER — LIDOCAINE-EPINEPHRINE 2 %-1:100000 IJ SOLN: 10.0000 mL | Freq: Once | INTRAMUSCULAR | Status: DC

## 2019-10-09 NOTE — ED Triage Notes (Signed)
Pt arrived ambulatory into ED from home CC Laceration on right hand. Bleeding in controlled at this time. Pt reports "cleaning a wall in my house and I sliced my hand open on something metal or fiberglass type material".  Pt reports having tetanus shot in last <2 years 2019

## 2019-10-09 NOTE — Discharge Instructions (Signed)
Avoid soaking your wound in stagnant or dirty water such as while taking a bath. You can shower normally. Keep the area clean with mild soap and warm water. Do not apply peroxide or alcohol to your wound as this can break down newly forming skin and prolong wound healing. If you keep the area bandaged, change the dressing/bandage at least once per day. Your stitches will dissolve and do not need to be removed.

## 2019-10-09 NOTE — ED Provider Notes (Signed)
[facility] DEPT Provider Note   CSN: [id number] Arrival date & time: 10/09/19  2101     History Chief Complaint  Patient presents with  . Extremity Laceration    Right Hand    Angela Peters is a 39 y.o. female.   The history is provided by the patient. No language interpreter was used.  Laceration Location:  Hand Hand laceration location:  R palm Length:  1.5 Depth:  Through dermis Bleeding: controlled   Time since incident:  2 hours Injury mechanism: "something metal or fiberglass" Pain details:    Quality:  Aching   Severity:  Mild   Timing:  Constant   Progression:  Unchanged Worsened by:  Pressure and movement Ineffective treatments:  None tried Tetanus status:  Up to date Associated symptoms: no focal weakness, no numbness and no streaking        Past Medical History:  Diagnosis Date  . Asthma     Patient Active Problem List   Diagnosis Date Noted  . FEMALE INFERTILITY 12/27/2009  . ABSENCE OF MENSTRUATION 02/03/2009  . OVERWEIGHT 01/13/2008  . 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 BREAST MASSES, BILATERAL 12/22/2007  . OBESITY, NOS 08/28/2006  . TOBACCO DEPENDENCE 08/28/2006  . ASTHMA, UNSPECIFIED 08/28/2006  . CERVICAL DYSPLASIA 08/28/2006  . PAPANICOLAOU SMEAR, ABNORMAL 08/28/2006  . PAPANICOLAOU SMEAR, ABNORMAL 08/28/2006    No past surgical history on file.   OB History   No obstetric history on file.     No family history on file.  Social History   Tobacco Use  . Smoking status: Current Every Day Smoker  Substance Use Topics  . Alcohol use: Yes  . Drug use: No    Home Medications Prior to Admission medications   Not on File    Allergies    Penicillins  Review of Systems   Review of Systems  Skin: Positive for wound.  Neurological: Negative for focal weakness.  Ten systems reviewed and are negative for acute change, except as noted in the HPI.    Physical Exam Updated Vital  Signs BP (!) 163/114 (BP Location: Left Arm)   Pulse 64   Temp 98.6 F (37 C) (Oral)   Resp 18   SpO2 99%   Physical Exam Vitals and nursing note reviewed.  Constitutional:      General: She is not in acute distress.    Appearance: She is well-developed. She is not diaphoretic.     Comments: Nontoxic appearing and in NAD  HENT:     Head: Normocephalic and atraumatic.  Eyes:     General: No scleral icterus.    Conjunctiva/sclera: Conjunctivae normal.  Cardiovascular:     Rate and Rhythm: Normal rate and regular rhythm.     Pulses: Normal pulses.     Comments: Distal radial pulse 2+ in the right upper extremity.  Capillary refill brisk in all digits of the right hand Pulmonary:     Effort: Pulmonary effort is normal. No respiratory distress.     Comments: Respirations even and unlabored Musculoskeletal:        General: Normal range of motion.       Hands:     Cervical back: Normal range of motion.  Skin:    General: Skin is warm and dry.     Coloration: Skin is not pale.     Findings: No erythema or rash.  Neurological:     Mental Status: She is alert and oriented to person, place, and  time.     Comments: Full ROM of digits of the R hand. Able to make a closed fist.  Psychiatric:        Behavior: Behavior normal.     ED Results / Procedures / Treatments   Labs (all labs ordered are listed, but only abnormal results are displayed) Labs Reviewed - No data to display  EKG None  Radiology No results found.  Procedures Procedures (including critical care time)  LACERATION REPAIR Performed by: Antony Madura Authorized by: Antony Madura Consent: Verbal consent obtained. Risks and benefits: risks, benefits and alternatives were discussed Consent given by: patient Patient identity confirmed: provided demographic data Prepped and Draped in normal sterile fashion Wound explored  Laceration Location: R palm  Laceration Length: 1.5cm  No Foreign Bodies seen or  palpated  Anesthesia: local infiltration  Local anesthetic: lidocaine 1% with epinephrine  Anesthetic total: 4 ml  Irrigation method: syringe Amount of cleaning: standard  Skin closure: 5-0 chromic  Number of sutures: 4  Technique: simple interrupted  Patient tolerance: Patient tolerated the procedure well with no immediate complications.   Medications Ordered in ED Medications  naproxen (NAPROSYN) tablet 500 mg (500 mg Oral Given 10/09/19 2302)  lidocaine-EPINEPHrine (XYLOCAINE W/EPI) 2 %-1:200000 (PF) injection 10 mL (10 mLs Infiltration Given by Other 10/09/19 2255)    ED Course  I have reviewed the triage vital signs and the nursing notes.  Pertinent labs & imaging results that were available during my care of the patient were reviewed by me and considered in my medical decision making (see chart for details).    MDM Rules/Calculators/A&P                      Tdap UTD. Pressure irrigation performed. Laceration occurred < 8 hours prior to repair which was well tolerated. Pt has no comorbidities to effect normal wound healing. Discussed suture home care with patient and answered questions; this includes high likelihood that the skin flap that was tacked back over the wound may not be viable. In the case of non-viability, skin flap would dry up and fall off on its own. Patient verbalizes understanding of this healing process. She is able to follow up for wound check PRN. Return precautions discussed and provided. Patient discharged in stable condition with no unaddressed concerns.   Final Clinical Impression(s) / ED Diagnoses Final diagnoses:  Laceration of right palm, initial encounter    Rx / DC Orders ED Discharge Orders    None       Antony Madura, PA-C 10/10/19 0013    Cathren Laine, MD 10/10/19 1525

## 2019-10-09 NOTE — ED Notes (Signed)
PT DISCHARGED. INSTRUCTIONS GIVEN. AAOX4. PT IN NO APPARENT DISTRESS OR PAIN. THE OPPORTUNITY TO ASK QUESTIONS WAS PROVIDED. 

## 2021-12-05 ENCOUNTER — Other Ambulatory Visit: Payer: Self-pay

## 2021-12-05 ENCOUNTER — Encounter (HOSPITAL_COMMUNITY): Payer: Self-pay

## 2021-12-05 ENCOUNTER — Emergency Department (HOSPITAL_COMMUNITY)
Admission: EM | Admit: 2021-12-05 | Discharge: 2021-12-05 | Disposition: A | Discharge status: Home / Self Care | Payer: Self-pay | Attending: Emergency Medicine | Admitting: Emergency Medicine

## 2021-12-05 DIAGNOSIS — R21 Rash and other nonspecific skin eruption: Secondary | ICD-10-CM | POA: Insufficient documentation

## 2021-12-05 DIAGNOSIS — J45909 Unspecified asthma, uncomplicated: Secondary | ICD-10-CM | POA: Insufficient documentation

## 2021-12-05 DIAGNOSIS — I10 Essential (primary) hypertension: Secondary | ICD-10-CM | POA: Insufficient documentation

## 2021-12-05 LAB — CBC WITH DIFFERENTIAL/PLATELET
Abs Immature Granulocytes: 0.02 10*3/uL (ref 0.00–0.07)
Basophils Absolute: 0.1 10*3/uL (ref 0.0–0.1)
Basophils Relative: 1 %
Eosinophils Absolute: 0.3 10*3/uL (ref 0.0–0.5)
Eosinophils Relative: 4 %
HCT: 44.8 % (ref 36.0–46.0)
Hemoglobin: 14.4 g/dL (ref 12.0–15.0)
Immature Granulocytes: 0 %
Lymphocytes Relative: 39 %
Lymphs Abs: 2.7 10*3/uL (ref 0.7–4.0)
MCH: 27.5 pg (ref 26.0–34.0)
MCHC: 32.1 g/dL (ref 30.0–36.0)
MCV: 85.7 fL (ref 80.0–100.0)
Monocytes Absolute: 0.6 10*3/uL (ref 0.1–1.0)
Monocytes Relative: 8 %
Neutro Abs: 3.4 10*3/uL (ref 1.7–7.7)
Neutrophils Relative %: 48 %
Platelets: 433 10*3/uL — ABNORMAL HIGH (ref 150–400)
RBC: 5.23 MIL/uL — ABNORMAL HIGH (ref 3.87–5.11)
RDW: 14.1 % (ref 11.5–15.5)
WBC: 7.1 10*3/uL (ref 4.0–10.5)
nRBC: 0 % (ref 0.0–0.2)

## 2021-12-05 LAB — BASIC METABOLIC PANEL
Anion gap: 5 (ref 5–15)
BUN: 11 mg/dL (ref 6–20)
CO2: 27 mmol/L (ref 22–32)
Calcium: 9.4 mg/dL (ref 8.9–10.3)
Chloride: 108 mmol/L (ref 98–111)
Creatinine, Ser: 0.85 mg/dL (ref 0.44–1.00)
GFR, Estimated: >60 mL/min (ref >60)
Glucose, Bld: 88 mg/dL (ref 70–99)
Potassium: 3.7 mmol/L (ref 3.5–5.1)
Sodium: 140 mmol/L (ref 135–145)

## 2021-12-05 MED ORDER — AMLODIPINE BESYLATE 5 MG PO TABS: 5.0000 mg | ORAL_TABLET | Freq: Every day | ORAL | 0 refills | Status: AC

## 2021-12-05 MED ORDER — AMLODIPINE BESYLATE 5 MG PO TABS: 5.0000 mg | ORAL_TABLET | Freq: Every day | ORAL | 0 refills | Status: DC

## 2021-12-05 MED ORDER — DOXYCYCLINE HYCLATE 100 MG PO CAPS: 100.0000 mg | ORAL_CAPSULE | Freq: Two times a day (BID) | ORAL | 0 refills | Status: DC

## 2021-12-05 MED ORDER — DOXYCYCLINE HYCLATE 100 MG PO CAPS: 100.0000 mg | ORAL_CAPSULE | Freq: Two times a day (BID) | ORAL | 0 refills | Status: AC

## 2021-12-05 NOTE — Discharge Instructions (Signed)
You were seen today for a rash on the lower left limb.  Based on the appearance of the rash, concern for possible tickborne cause.  I have prescribed doxycycline to cover for possible tickborne illness.  You have been hypertensive at today's visit.  I have prescribed a medication called amlodipine to help with blood pressure.  You need to establish care with a primary care provider for further blood pressure management

## 2021-12-05 NOTE — ED Triage Notes (Signed)
Pt reports bruise-like area to left calf x4 days that has become bigger and darker.

## 2021-12-05 NOTE — ED Notes (Signed)
I provided reinforced discharge education based off of discharge instructions. Pt acknowledged and understood my education. Pt had no further questions/concerns for provider/myself.  °

## 2021-12-05 NOTE — ED Provider Notes (Signed)
[facility]-EMERGENCY DEPT Provider Note   CSN: [id number] Arrival date & time: 12/05/21  1515     History  Chief Complaint  Patient presents with   Wound Check    Angela Peters is a 41 y.o. female.  Patient presents with concerns over a rash on her lower left leg.  Patient states that the rash appeared approximately 4 days ago and has grown in size since that time.  The patient denies any known tick bites.  Denies any shortness of breath, abdominal pain, nausea, vomiting, articular symptoms.  Endorses mild discomfort in the rash area, without pruritus.  Patient with history of asthma  HPI     Home Medications Prior to Admission medications   Medication Sig Start Date End Date Taking? Authorizing Provider  amLODipine (NORVASC) 5 MG tablet Take 1 tablet (5 mg total) by mouth daily. 12/05/21 01/05/22 Yes Darrick Grinder, PA-C  doxycycline (VIBRAMYCIN) 100 MG capsule Take 1 capsule (100 mg total) by mouth 2 (two) times daily. 12/05/21  Yes Darrick Grinder, PA-C      Allergies    Penicillins    Review of Systems   Review of Systems  Constitutional:  Negative for fever.  Respiratory:  Negative for shortness of breath.   Cardiovascular:  Negative for chest pain.  Gastrointestinal:  Negative for abdominal pain, diarrhea, nausea and vomiting.  Genitourinary:  Negative for dysuria.  Skin:  Positive for rash.   Physical Exam Updated Vital Signs BP (!) 167/117 (BP Location: Left Arm)   Pulse (!) 55   Temp 98.4 F (36.9 C) (Oral)   Resp 16   Ht 5\' 7"  (1.702 m)   Wt 116.3 kg   SpO2 100%   BMI 40.14 kg/m  Physical Exam Vitals and nursing note reviewed.  Constitutional:      General: She is not in acute distress. HENT:     Head: Normocephalic.  Eyes:     Conjunctiva/sclera: Conjunctivae normal.  Cardiovascular:     Rate and Rhythm: Normal rate and regular rhythm.     Pulses: Normal pulses.     Heart sounds: Normal heart sounds.  Pulmonary:      Effort: Pulmonary effort is normal.     Breath sounds: Normal breath sounds.  Musculoskeletal:        General: Normal range of motion.     Cervical back: Normal range of motion.  Skin:    Findings: Erythema and rash present.     Comments: Round erythematous area on the lower left leg with centralized clearing.  Approximately 7 cm in diameter  Neurological:     Mental Status: She is alert.    ED Results / Procedures / Treatments   Labs (all labs ordered are listed, but only abnormal results are displayed) Labs Reviewed  CBC WITH DIFFERENTIAL/PLATELET - Abnormal; Notable for the following components:      Result Value   RBC 5.23 (*)    Platelets 433 (*)    All other components within normal limits  BASIC METABOLIC PANEL    EKG None  Radiology No results found.  Procedures Procedures    Medications Ordered in ED Medications - No data to display  ED Course/ Medical Decision Making/ A&P                           Medical Decision Making Amount and/or Complexity of Data Reviewed Labs: ordered.  Risk Prescription drug management.  The patient presents with a chief complaint of rash.  The rash is consistent with erythema migrans.  Concern for possible early Lyme disease.  The patient has no other symptoms at this time.  She states she had mild pain in her left leg yesterday but no pain at this time.  She has had some discomfort around the area of the rash.  Denies any known tick bite but states he does have pets in the home.  Plan to discharge patient home on doxycycline.  This will cover for possible early Lyme disease.  The patient does appear to be hypertensive at today's visit and requested medication for blood pressure.  This does seem reasonable as the patient has no primary care at this time.  I will start the patient on amlodipine with recommendations to follow-up with primary care.  We will start with a low-dose of 5 mg daily.  Discharge home        Final  Clinical Impression(s) / ED Diagnoses Final diagnoses:  Hypertension, unspecified type  Rash    Rx / DC Orders ED Discharge Orders          Ordered    amLODipine (NORVASC) 5 MG tablet  Daily        12/05/21 1906    doxycycline (VIBRAMYCIN) 100 MG capsule  2 times daily        12/05/21 1906              Pamala Duffel 12/05/21 Atlee Abide, MD 12/05/21 2246

## 2021-12-05 NOTE — ED Provider Triage Note (Signed)
Emergency Medicine Provider Triage Evaluation Note  Angela Peters , a 41 y.o. female  was evaluated in triage.  Pt complains of painful evolving bruise/rash on lower left extremity.  Patient states that 4 days ago she noticed a spot on her leg.  A red rash has spread from the center since that time.  Denies any known tick bites, other bites.  Denies any known injury.  Review of Systems  Positive: Skin lesion lower left leg Negative: Chest pain, shortness of breath  Physical Exam  BP (!) 170/127 (BP Location: Left Arm)   Pulse 89   Temp 98.4 F (36.9 C) (Oral)   Resp 18   Ht 5\' 7"  (1.702 m)   Wt 116.3 kg   SpO2 95%   BMI 40.14 kg/m  Gen:   Awake, no distress   Resp:  Normal effort  MSK:   Moves extremities without difficulty  Other:    Medical Decision Making  Medically screening exam initiated at 4:45 PM.  Appropriate orders placed.  Angela Peters was informed that the remainder of the evaluation will be completed by another provider, this initial triage assessment does not replace that evaluation, and the importance of remaining in the ED until their evaluation is complete.     Azalia Bilis, PA-C 12/05/21 1646

## 2024-05-24 ENCOUNTER — Encounter: Payer: Self-pay | Admitting: Plastic Surgery

## 2024-05-24 ENCOUNTER — Ambulatory Visit: Admitting: Plastic Surgery

## 2024-05-24 VITALS — BP 152/103 | Ht 65.7 in | Wt 184.0 lb

## 2024-05-24 DIAGNOSIS — Z720 Tobacco use: Secondary | ICD-10-CM | POA: Diagnosis not present

## 2024-05-24 DIAGNOSIS — N6481 Ptosis of breast: Secondary | ICD-10-CM

## 2024-05-24 DIAGNOSIS — Z6828 Body mass index (BMI) 28.0-28.9, adult: Secondary | ICD-10-CM

## 2024-05-24 NOTE — Progress Notes (Signed)
 Patient ID: Angela Peters, female    DOB: 08-01-1980, 43 y.o.   MRN: [id number]   Chief Complaint  Patient presents with   Advice Only    The patient is a 43 y.o. female with a history of weight loss of 85 pounds this year.  She has been on medication and being diligent about her activity and food intake.  She complains that the breasts are very saggy and it is difficult to find a bra.  It is hard to get the breast tissue to stay in the bra. Her breasts are extremely large and fairly symmetric.  She has hyperpigmentation of the inframammary area on both sides.  The sternal to nipple distance on the right is 37 cm and the left is 37 cm.  The IMF distance is 17 cm.  She is 5 feet 7 inches tall and weighs 184 pounds.  The BMI = 28.8 kg/m.  Preoperative bra size = G cup prior to the weight loss.  Mammogram history: Negative from 2025 January she will need another 1 before the surgery.  Tobacco use: Yes.   The patient expresses the desire to pursue surgical intervention.    Review of Systems  Constitutional: Negative.   HENT: Negative.    Eyes: Negative.   Respiratory: Negative.    Cardiovascular: Negative.   Gastrointestinal: Negative.   Endocrine: Negative.   Genitourinary: Negative.   Musculoskeletal: Negative.     Past Medical History:  Diagnosis Date   Asthma     No past surgical history on file.    Current Outpatient Medications:    metformin (FORTAMET) 500 MG (OSM) 24 hr tablet, Take 500 mg by mouth daily with breakfast., Disp: , Rfl:    tirzepatide 5 MG/0.5ML injection vial, Inject into the skin., Disp: , Rfl:    amLODipine  (NORVASC ) 5 MG tablet, Take 1 tablet (5 mg total) by mouth daily. (Patient not taking: Reported on 05/24/2024), Disp: 31 tablet, Rfl: 0   doxycycline  (VIBRAMYCIN ) 100 MG capsule, Take 1 capsule (100 mg total) by mouth 2 (two) times daily. (Patient not taking: Reported on 05/24/2024), Disp: 20 capsule, Rfl: 0   Objective:   Vitals:   05/24/24  1130 05/24/24 1136  BP: (!) 155/99 (!) 152/103    Physical Exam Vitals reviewed.  Constitutional:      Appearance: Normal appearance.  HENT:     Head: Atraumatic.  Cardiovascular:     Rate and Rhythm: Normal rate.     Pulses: Normal pulses.  Pulmonary:     Effort: Pulmonary effort is normal.  Abdominal:     General: There is no distension.     Palpations: Abdomen is soft.     Tenderness: There is no abdominal tenderness.  Skin:    General: Skin is warm.     Capillary Refill: Capillary refill takes less than 2 seconds.  Neurological:     Mental Status: She is alert and oriented to person, place, and time.  Psychiatric:        Mood and Affect: Mood normal.        Behavior: Behavior normal.        Thought Content: Thought content normal.        Judgment: Judgment normal.     Assessment & Plan:  Ptosis of breast  The patient is a good candidate for bilateral mastopexy.  She will need to be 3 months tobacco free before the surgery.  She will also need an updated mammogram.  I am happy to send the patient a quote.  She will let us  know when she is ready to submit for surgery.  Pictures were obtained of the patient and placed in the chart with the patient's or guardian's permission.   Estefana RAMAN Dierks Wach, DO

## 2024-06-15 ENCOUNTER — Institutional Professional Consult (permissible substitution): Admitting: Plastic Surgery
